# Patient Record
Sex: Female | Born: 1960
Health system: Southern US, Community
[De-identification: ages and names within clinical notes are randomized; demographics above are authoritative.]

## PROBLEM LIST (undated history)

## (undated) DIAGNOSIS — D649 Anemia, unspecified: Secondary | ICD-10-CM

## (undated) HISTORY — PX: NO PAST SURGERIES: SHX2092

---

## 2009-08-19 ENCOUNTER — Emergency Department (HOSPITAL_COMMUNITY): Admission: EM | Admit: 2009-08-19 | Discharge: 2009-08-19 | Payer: Self-pay | Admitting: Emergency Medicine

## 2009-09-13 ENCOUNTER — Ambulatory Visit: Payer: Self-pay | Admitting: Internal Medicine

## 2009-09-13 ENCOUNTER — Encounter (INDEPENDENT_AMBULATORY_CARE_PROVIDER_SITE_OTHER): Payer: Self-pay | Admitting: Adult Health

## 2009-09-13 ENCOUNTER — Ambulatory Visit (HOSPITAL_COMMUNITY): Admission: RE | Admit: 2009-09-13 | Discharge: 2009-09-13 | Payer: Self-pay | Admitting: Internal Medicine

## 2009-09-13 LAB — CONVERTED CEMR LAB
ALT: 17 units/L (ref 0–35)
AST: 26 units/L (ref 0–37)
Basophils Relative: 0 % (ref 0–1)
CO2: 21 meq/L (ref 19–32)
Cholesterol: 204 mg/dL — ABNORMAL HIGH (ref 0–200)
LDL Cholesterol: 106 mg/dL — ABNORMAL HIGH (ref 0–99)
Lymphs Abs: 2.2 10*3/uL (ref 0.7–4.0)
MCHC: 30.7 g/dL (ref 30.0–36.0)
Monocytes Relative: 7 % (ref 3–12)
Neutro Abs: 3.1 10*3/uL (ref 1.7–7.7)
Neutrophils Relative %: 53 % (ref 43–77)
RBC: 5.08 M/uL (ref 3.87–5.11)
Sodium: 141 meq/L (ref 135–145)
Total Bilirubin: 0.3 mg/dL (ref 0.3–1.2)
Total Protein: 7.6 g/dL (ref 6.0–8.3)
VLDL: 27 mg/dL (ref 0–40)
WBC: 5.9 10*3/uL (ref 4.0–10.5)

## 2009-09-16 ENCOUNTER — Ambulatory Visit: Payer: Self-pay | Admitting: Internal Medicine

## 2009-09-19 ENCOUNTER — Encounter: Admission: RE | Admit: 2009-09-19 | Discharge: 2009-09-19 | Payer: Self-pay | Admitting: Internal Medicine

## 2009-10-04 ENCOUNTER — Ambulatory Visit: Payer: Self-pay | Admitting: Internal Medicine

## 2009-11-03 ENCOUNTER — Ambulatory Visit: Payer: Self-pay | Admitting: Internal Medicine

## 2009-11-03 ENCOUNTER — Encounter (INDEPENDENT_AMBULATORY_CARE_PROVIDER_SITE_OTHER): Payer: Self-pay | Admitting: Adult Health

## 2009-11-03 LAB — CONVERTED CEMR LAB
Hemoglobin: 11.9 g/dL — ABNORMAL LOW (ref 12.0–15.0)
Lymphocytes Relative: 48 % — ABNORMAL HIGH (ref 12–46)
Lymphs Abs: 2.9 10*3/uL (ref 0.7–4.0)
MCHC: 31.3 g/dL (ref 30.0–36.0)
Monocytes Absolute: 0.3 10*3/uL (ref 0.1–1.0)
Monocytes Relative: 5 % (ref 3–12)
Neutro Abs: 2.6 10*3/uL (ref 1.7–7.7)
Neutrophils Relative %: 44 % (ref 43–77)
RBC: 5.08 M/uL (ref 3.87–5.11)
WBC: 6 10*3/uL (ref 4.0–10.5)

## 2011-12-04 ENCOUNTER — Inpatient Hospital Stay
Admission: EM | Admit: 2011-12-04 | Discharge: 2011-12-06 | Disposition: A | Discharge status: Home Health | Payer: Self-pay | Attending: Internal Medicine | Admitting: Internal Medicine

## 2011-12-04 ENCOUNTER — Other Ambulatory Visit (HOSPITAL_COMMUNITY): Payer: Self-pay | Admitting: Emergency Medicine

## 2011-12-04 DIAGNOSIS — J189 Pneumonia, unspecified organism: Principal | ICD-10-CM

## 2011-12-04 LAB — URINALYSIS
Bilirubin: NEGATIVE
Glucose: NEGATIVE
Ketones: NEGATIVE
Nitrite: NEGATIVE
Specific Gravity: 1.008 (ref 1.002–1.030)
pH: 6 (ref 5.0–8.0)

## 2011-12-04 MED ORDER — FERROUS SULFATE 324 (65 FE) MG PO TBEC
324.0000 mg | DELAYED_RELEASE_TABLET | Freq: Every day | ORAL | Status: DC
Start: 2011-12-05 — End: 2011-12-06
  Administered 2011-12-05 – 2011-12-06 (×2): 324 mg via ORAL
  Filled 2011-12-04 (×2): qty 1

## 2011-12-04 MED ORDER — ACETAMINOPHEN 325 MG PO TABS
975.00 mg | ORAL_TABLET | Freq: Once | ORAL | Status: AC
Start: 2011-12-04 — End: 2011-12-04
  Filled 2011-12-04: qty 3

## 2011-12-04 MED ORDER — HEPARIN SODIUM (PORCINE) 10000 UNIT/ML IJ SOLN
5000.0000 [IU] | Freq: Three times a day (TID) | INTRAMUSCULAR | Status: DC
Start: 2011-12-04 — End: 2011-12-06
  Filled 2011-12-04 (×2): qty 0.5

## 2011-12-04 MED ORDER — ACETAMINOPHEN 325 MG PO TABS
650.0000 mg | ORAL_TABLET | ORAL | Status: DC | PRN
Start: 2011-12-04 — End: 2011-12-06
  Administered 2011-12-05: 650 mg via ORAL
  Filled 2011-12-04: qty 2

## 2011-12-04 MED ORDER — SODIUM CHLORIDE 0.9 % IV BOLUS
1000.00 mL | INJECTION | Freq: Once | INTRAVENOUS | Status: AC
Start: 2011-12-04 — End: 2011-12-04
  Administered 2011-12-04: 1000 mL via INTRAVENOUS

## 2011-12-04 MED ORDER — SODIUM CHLORIDE 0.9 % IV SOLN
1000.0000 mg | INTRAVENOUS | Status: DC
Start: 2011-12-05 — End: 2011-12-05
  Administered 2011-12-05: 1000 mg via INTRAVENOUS
  Filled 2011-12-04: qty 1000

## 2011-12-04 MED ORDER — FERROUS SULFATE 325 (65 FE) MG OR TABS: 325.00 mg | ORAL_TABLET | Freq: Every day | ORAL | Status: AC

## 2011-12-04 MED ORDER — SODIUM CHLORIDE 0.9 % IV SOLN
INTRAVENOUS | Status: DC | PRN
Start: 2011-12-04 — End: 2011-12-06

## 2011-12-04 MED ORDER — SODIUM CHLORIDE 3 % IN NEBU
5.0000 mL | INHALATION_SOLUTION | Freq: Three times a day (TID) | RESPIRATORY_TRACT | Status: DC
Start: 2011-12-04 — End: 2011-12-05
  Filled 2011-12-04: qty 15

## 2011-12-04 MED ORDER — SODIUM CHLORIDE 0.9 % IV SOLN
500.00 mg | Freq: Once | INTRAVENOUS | Status: AC
Start: 2011-12-04 — End: 2011-12-04
  Filled 2011-12-04: qty 500

## 2011-12-04 MED ORDER — SODIUM CHLORIDE 0.9 % IJ SOLN (CUSTOM)
3.0000 mL | INTRAMUSCULAR | Status: DC | PRN
Start: 2011-12-04 — End: 2011-12-06

## 2011-12-04 MED ORDER — SODIUM CHLORIDE 0.9 % IJ SOLN (CUSTOM)
3.0000 mL | Freq: Three times a day (TID) | INTRAMUSCULAR | Status: DC
Start: 2011-12-04 — End: 2011-12-06
  Administered 2011-12-04 – 2011-12-06 (×4): 3 mL via INTRAVENOUS

## 2011-12-04 MED ORDER — CEFTRIAXONE SODIUM 1 GM IJ SOLR
1000.00 mg | Freq: Once | INTRAMUSCULAR | Status: AC
Start: 2011-12-04 — End: 2011-12-04
  Filled 2011-12-04: qty 1000

## 2011-12-04 MED ORDER — KETOROLAC TROMETHAMINE 30 MG/ML IJ SOLN
30.00 mg | Freq: Once | INTRAMUSCULAR | Status: AC
Start: 2011-12-04 — End: 2011-12-04
  Filled 2011-12-04: qty 1

## 2011-12-04 MED ORDER — SODIUM CHLORIDE 0.9 % IV SOLN
500.0000 mg | INTRAVENOUS | Status: DC
Start: 2011-12-05 — End: 2011-12-05
  Filled 2011-12-04 (×2): qty 500

## 2011-12-05 LAB — ECG, COMPLETE (HC/~~LOC~~/ENCINITAS)
QRS INTERVAL/DURATION: 82 ms
VENTRICULAR RATE: 82 {beats}/min

## 2011-12-05 MED ORDER — IBUPROFEN 600 MG OR TABS
600.0000 mg | ORAL_TABLET | Freq: Four times a day (QID) | ORAL | Status: DC | PRN
Start: 2011-12-05 — End: 2011-12-06
  Filled 2011-12-05 (×3): qty 1

## 2011-12-05 MED ORDER — IBUPROFEN 600 MG OR TABS
600.0000 mg | ORAL_TABLET | Freq: Four times a day (QID) | ORAL | Status: DC | PRN
Start: 2011-12-05 — End: 2011-12-05

## 2011-12-05 MED ORDER — MOXIFLOXACIN HCL 400 MG OR TABS
400.0000 mg | ORAL_TABLET | Freq: Every day | ORAL | Status: DC
Start: 2011-12-06 — End: 2011-12-06
  Administered 2011-12-06: 400 mg via ORAL
  Filled 2011-12-05 (×2): qty 1

## 2011-12-06 MED ORDER — LEVOFLOXACIN 500 MG OR TABS
500.0000 mg | ORAL_TABLET | Freq: Every day | ORAL | Status: DC
Start: 2011-12-07 — End: 2011-12-06

## 2011-12-06 MED ORDER — MOXIFLOXACIN HCL 400 MG OR TABS
400.0000 mg | ORAL_TABLET | Freq: Every day | ORAL | Status: DC
Start: 2011-12-07 — End: 2011-12-06

## 2011-12-06 MED ORDER — LEVOFLOXACIN 500 MG OR TABS
750.00 mg | ORAL_TABLET | Freq: Every day | ORAL | Status: AC
Start: 2011-12-06 — End: 2011-12-10

## 2011-12-21 ENCOUNTER — Encounter (HOSPITAL_COMMUNITY): Payer: Self-pay

## 2012-06-18 ENCOUNTER — Emergency Department
Admit: 2012-06-18 | Discharge: 2012-06-18 | Disposition: A | Discharge status: Home Health | Attending: Emergency Medicine | Admitting: Emergency Medicine

## 2012-06-18 ENCOUNTER — Encounter (HOSPITAL_COMMUNITY): Payer: Self-pay

## 2012-06-18 MED ORDER — HYDROCODONE-ACETAMINOPHEN 5-500 MG OR TABS
1.0000 | ORAL_TABLET | Freq: Once | ORAL | Status: AC
Start: 2012-06-18 — End: 2012-06-18

## 2012-06-18 MED ORDER — HYDROCODONE-ACETAMINOPHEN 5-500 MG OR TABS
1.0000 | ORAL_TABLET | Freq: Once | ORAL | Status: AC
Start: 2012-06-18 — End: 2012-06-18
  Administered 2012-06-18: 1 via ORAL
  Filled 2012-06-18: qty 1

## 2012-06-18 NOTE — ED Attending Note (Signed)
ED ATTENDING NOTE:    The patient was interviewed and examined. I agree with the resident's assessment. The pertinent part of the history includes pt felt pop posterior left knee while running no instability.    PMH/MEDS/ALL confirmed  No past medical history on file.    FH: no DM  SH: no cigarettes  ROS as per HPI additionally,   Review of Systems   Constitutional: Negative for fever.   Respiratory: Negative for cough and shortness of breath.    Cardiovascular: Negative for chest pain and leg swelling.   Gastrointestinal: Negative for abdominal pain and abdominal distention.   Endocrine: Negative for polyuria.   Genitourinary: Negative for dysuria.   Musculoskeletal: Negative for back pain.   Skin: Negative for rash.   Allergic/Immunologic: Negative for immunocompromised state.   Neurological: Negative for headaches.   Hematological: Negative for adenopathy.   Otherwise ROS is negative                    WDWN No acute distress  left knee no effusion  rom sl limited by pain  Stable to strain, neg drawer    xrays no fracture    I agree with the resident assessment and plan. Evaluation and treatment including analgesia  have been initiated.

## 2012-06-18 NOTE — Discharge Instructions (Signed)
Follow up with your primary doctor. If your pain continues you may need an MRI to rule out a problem in the soft tissues. Return for worsening symptoms.     Knee Pain NOS    You have been seen for knee pain.    There are a few causes for knee pain. The doctor feels your knee pain is not from an injury to your knee s bones or ligaments.     Injury to the ligaments or bones is not the only cause of knee pain. There are other causes. These include:   Tendonitis. This is the inflammation (swelling) of the tendons. Tendons are the thick cords that connect the muscles around the knee to the bones of the knee joint.   Bursitis. This is the inflammation (swelling) of the fluid-filled sacs that cushion the knee joint.   Arthritis (inflammation of joints).   Gout (swelling of the joints).   Knee injuries from overuse.    Some things you can do to treat your knee pain are:   Apply ice to the knee with an ice pack. Be sure to put a towel between the ice pack and your skin. You can do this for 15 minutes at a time, several times a day.   Use anti-inflammatory medicine like Ibuprofen (Advil, Motrin) to help the pain and swelling.   Avoid doing things that put a lot of stress on your knee joints. This includes running or playing tennis.    See an Orthopedic Surgeon about knee pain.    Use a knee immobilizer. This will stabilize your knee and help with your knee pain.    You should use crutches to help you walk.    YOU SHOULD SEEK MEDICAL ATTENTION IMMEDIATELY, EITHER HERE OR AT THE NEAREST EMERGENCY DEPARTMENT, IF ANY OF THE FOLLOWING OCCUR:   - Your knee pain gets worse.   You have fevers (temperature higher than 100.24F or 38C) or chills or your knee gets more red or warm.   You have any other problems or concerns.

## 2012-06-18 NOTE — ED Provider Notes (Signed)
History  Chief Complaint   Patient presents with   . Knee Pain     Pt states she was running to catch the trolley when she felt a pop behind her L knee whith sudden onset of pain. now with limited ROM.     HPI  51 year old female with hix of old injury to left ankle, hit her left knee at work last week then while running to catch trolley today she felt a pop behind left knee, +sudden pain, no fall, no head strike or loc. Ambulatory. No weakness/numbness. No redness/swelling, fevers/chills, nausea/vomiting. No history of clots. No chest pain, shortness of breath, abd pain, n/v/d.     No past medical history on file.    No past surgical history on file.    No family history on file.    History   Substance Use Topics   . Smoking status: Not on file   . Smokeless tobacco: Not on file   . Alcohol Use: Not on file   denies tobacco, etoh, other drugs     Review of Systems  All other systems reviewed and negative except as above in HPI    Physical Exam  BP 142/94  Pulse 66  Temp(Src) 98.7 F (37.1 C)  Resp 18  Ht 5\' 1"  (1.549 m)  Wt 68.04 kg (150 lb)  BMI 28.36 kg/m2  SpO2 99%    Physical Exam  Constitutional: Oriented to person, place, and time. Appears well-developed and well-nourished. No distress.   HENT:   Head: Normocephalic and atraumatic.   Mouth/Throat: Oropharynx is clear and moist.   Eyes: EOM are normal. Pupils are equal, round, and reactive to light.   Neck: Neck supple.   Cardiovascular: Normal rate, regular rhythm, normal heart sounds and intact distal pulses.    Pulmonary/Chest: Effort normal and breath sounds normal.   Abdominal: Soft. Bowel sounds are normal. No distension. There is no tenderness.   Musculoskeletal: Left knee with ttp posteriorly. No effusion, erythema. Normal range of motion. Neg anterior/post drawer tests, no varus/valgus laxity, neg mcmurrays. Neg homans. NV intact distally. Ambulatory with nl gait  Neurological: Alert and oriented to person, place, and time. 5/5 strength &  intact light touch sensation in all extremities      ED Course/Medical Decision Making Narrative  51 year old female with left knee pain after feeling pop while running. Benign exam, no effusion. Doubt fracture/dislocation, confirmed with xray. No ligamentous laxity. Ambulatory. NV intact. ?ruptured baker's cyst, muscle strain. Doubt clot, infection. Given analgesia, follow up with primary doctor, return precautions discussed    Discussed with [facility]          Critical Care Time            Additional Notes      Home Medication List  Prior to Admission Medications   Medication Last Dose Informant Patient Reported? Taking?   ferrous sulfate 325 (65 FE) MG tablet Not Taking  Yes No   Take 325 mg by mouth daily.          Katha Kuehne, Janey Genta, MD  Resident  06/18/12 [phone]

## 2012-06-20 NOTE — ED Follow-up Note (Signed)
Follow-up type: Callback       Routine ED Patient Call Back    Patient unable to be contacted, no message left number listed is disconnected

## 2012-07-03 NOTE — ED Follow-up Note (Signed)
Follow-up type: Callback       Routine ED Patient Call Back    Patient unable to be contacted, no message left

## 2013-03-16 ENCOUNTER — Emergency Department
Admission: EM | Admit: 2013-03-16 | Discharge: 2013-03-17 | Disposition: A | Discharge status: Home / Self Care | Payer: MEDICAID | Attending: Emergency Medicine | Admitting: Emergency Medicine

## 2013-03-16 DIAGNOSIS — D509 Iron deficiency anemia, unspecified: Secondary | ICD-10-CM | POA: Insufficient documentation

## 2013-03-16 DIAGNOSIS — R609 Edema, unspecified: Secondary | ICD-10-CM | POA: Insufficient documentation

## 2013-03-16 DIAGNOSIS — I10 Essential (primary) hypertension: Secondary | ICD-10-CM | POA: Insufficient documentation

## 2013-03-16 MED ORDER — FAMOTIDINE 20 MG OR TABS
20.00 mg | ORAL_TABLET | Freq: Once | ORAL | Status: AC
Start: 2013-03-16 — End: 2013-03-16
  Filled 2013-03-16: qty 1

## 2013-03-16 MED ORDER — BELLADONNA ALK-PHENOBARBITAL 16.2 MG/5ML PO ELIX
10.00 mL | ORAL_SOLUTION | Freq: Once | ORAL | Status: AC
Start: 2013-03-16 — End: 2013-03-16
  Filled 2013-03-16: qty 10

## 2013-03-16 MED ORDER — LIDOCAINE VISCOUS 2 % MT SOLN
10.00 mL | Freq: Once | OROMUCOSAL | Status: AC
Start: 2013-03-16 — End: 2013-03-16
  Administered 2013-03-16: 10 mL via ORAL
  Filled 2013-03-16: qty 15

## 2013-03-16 MED ORDER — ALUM & MAG HYDROXIDE-SIMETH 200-200-20 MG/5ML OR SUSP
30.00 mL | Freq: Once | ORAL | Status: AC
Start: 2013-03-16 — End: 2013-03-16
  Filled 2013-03-16: qty 30

## 2013-03-16 NOTE — ED Notes (Signed)
Pt ambulates to bathroom without difficulty.

## 2013-03-16 NOTE — ED Notes (Signed)
Dr Elliot at bedside

## 2013-03-17 LAB — ECG 12-LEAD
QRS INTERVAL/DURATION: 82 ms
VENTRICULAR RATE: 60 {beats}/min

## 2013-03-17 LAB — URINALYSIS
Nitrite: NEGATIVE
pH: 6 (ref 5.0–8.0)

## 2013-03-17 MED ORDER — OMEPRAZOLE 20 MG OR TBEC
20.00 mg | DELAYED_RELEASE_TABLET | Freq: Every day | ORAL | Status: AC
Start: 2013-03-17 — End: ?

## 2013-03-17 NOTE — ED Notes (Signed)
Pt given dc instructions, dr. Mechele Collin at bedside to speak to pt re: findings and follow up, pt knows to fu c optho clinic c phone # provided on dc paperwork or to return sooner if any concerns

## 2013-03-17 NOTE — ED Notes (Signed)
ECG given to Dr. Kahn c prior from [facility], copy in chart

## 2013-03-17 NOTE — ED Notes (Signed)
Signout Note    Assuming care of this 52 year old female with two days of BLE edema - fairly benign examination.  LFTs pending.  No DVTs by bedside ultrasound.  Likely discharge if labs reassuring.    Etter Sjogren, MD  03/17/13 [phone]

## 2013-03-17 NOTE — ED Provider Notes (Signed)
History  Chief Complaint   Patient presents with   . Leg Pain     pt c/o multiple complaints: primary complaint is BLE swelling & pain x2days. Pt also c/o intermittent dizziness and "seeing specks" x months & "buring" sensation to abd. denies cp/sob/f/c/n/v     HPI    [name] with past medical history of htn and pre diabetic. She has iron deficiency anemia as well. She is here for cc of bilateral lower extremity swelling. She states that she noticed this over the past few days. She states that she has been walking around a lot. She denies any history of renal liver disease or hx of heart failure. She has never used diuretics. She is also complaining of epigastric pain with eating. Endorses nausea, no vomiting. She denies any blood in her stool. She also has floaters in her visual field that she has had for three months. No bright lights. Endorses blurred vision, headache on occasion. Follows with her pcp who follows her anemia.     No past medical history on file.  htn  Dm    No past surgical history on file.  none    No family history on file.    History   Substance Use Topics   . Smoking status: Not on file   . Smokeless tobacco: Not on file   . Alcohol Use: Not on file   no alcohol, tobacco, illicit drug use.     Review of Systems  a 12 pt ros performed and neg unless otherwise stated in above hpi.     Physical Exam  BP 143/85  Pulse 68  Temp(Src) 97.3 F (36.3 C)  Resp 18  Ht 5\' 1"  (1.549 m)  Wt 83.915 kg (185 lb)  BMI 34.97 kg/m2  SpO2 99%    Physical Exam   Vitals reviewed.  Constitutional: She is oriented to person, place, and time. She appears well-developed and well-nourished. No distress.   HENT:   Head: Normocephalic and atraumatic.   Mouth/Throat: Oropharynx is clear and moist. No oropharyngeal exudate.   Eyes: Conjunctivae and EOM are normal. Pupils are equal, round, and reactive to light. No scleral icterus.   Neck: Normal range of motion. Neck supple. No JVD present.      Cardiovascular: Normal rate, regular rhythm, normal heart sounds and intact distal pulses.  Exam reveals no gallop and no friction rub.    No murmur heard.  Pulmonary/Chest: Effort normal and breath sounds normal. No respiratory distress. She has no wheezes. She has no rales.   Abdominal: Soft. Bowel sounds are normal. She exhibits no distension and no mass. There is tenderness. There is no rebound and no guarding.   Very minimal epigastric ttp   Musculoskeletal: Normal range of motion.   1 plus pitting edema bilaterally to the knee   Lymphadenopathy:     She has no cervical adenopathy.   Neurological: She is alert and oriented to person, place, and time. No cranial nerve deficit. She exhibits normal muscle tone. Coordination normal.   Skin: Skin is dry. No rash noted. She is not diaphoretic.   Psychiatric: She has a normal mood and affect.       Results for orders placed during the hospital encounter of 03/16/13   CBC WITH ADIFF, BLOOD       Result Value Range    WBC 4.6  4.0 - 10.0 1000/mm3    RBC 4.75  3.90 - 5.20 mill/mm3  Hgb 11.3  11.2 - 15.7 gm/dL    Hct 96.0  45.4 - 09.8 %    MCV 75.2 (*) 79.0 - 95.0 um3    MCH 23.8 (*) 26.0 - 32.0 pgm    MCHC 31.7 (*) 32.0 - 36.0 %    RDW 14.8 (*) 12.0 - 14.0 %    MPV 9.7  9.4 - 12.4 fL    Plt Count 192  140 - 370 1000/mm3    Segs 43  34 - 71 %    Lymphocytes 46  19 - 53 %    Monocytes 7  5 - 12 %    Eosinophils 4  1 - 7 %    Basophils 1  0 - 2 %    Absolute Neutrophil Count 1.9  1.6 - 7.0 1000/mm3    Abs Lymphs 2.1  0.8 - 3.1 1000/mm3    Abs Monos 0.3  0.2 - 0.8 1000/mm3    Abs Eosinophils 0.2  0.0 - 0.5 1000/mm3    Diff Type Automated      Plt Est Normal     BASIC METABOLIC PANEL, BLOOD       Result Value Range    Glucose 93  70 - 115 mg/dL    BUN 10  6 - 20 mg/dL    Creatinine 1.19  1.47 - 0.95 mg/dL    GFR >82      Sodium 141  136 - 145 mmol/L    Potassium 3.9  3.5 - 5.1 mmol/L    Chloride 104  98 - 107 mmol/L    Bicarbonate 25  22 - 29 mmol/L    Calcium 9.7  8.6 -  10.0 mg/dL   LIPASE, BLOOD       Result Value Range    Lipase 87 (*) 13 - 60 U/L   URINALYSIS       Result Value Range    Type Not Specified      Color Yellow  Yellow    Appearance Clear  Clear    Specific Gravity 1.018  1.002 - 1.030    pH 6.0  5.0 - 8.0    Protein Negative  Negative    Glucose Negative  Negative    Ketones Negative  Negative    Bilirubin Negative  Negative    Blood Moderate (*) Negative    Urobilinogen 0.2-1.0  0.2-1 EU/dL    Nitrite Negative  Negative    Leuk Esterase Small (*) Negative    WBC <1  0-2/HPF    RBC 0-2  0-2/HPF    Squam. Epithelial Cell <1  0-Few/HPF   PRO BNP, BLOOD       Result Value Range    BNPP 34  0 - 899 pg/mL   LIVER PANEL, BLOOD       Result Value Range    Total Protein 7.5  6.0 - 8.0 g/dL    Albumin 4.4  3.5 - 5.2 g/dL    Bilirubin, Dir 0.1  <0.2 mg/dL    Bilirubin, Tot 0.2  <1.2 mg/dL    AST (SGOT) 30  0 - 32 U/L    ALT (SGPT) 21  0 - 33 U/L    Alkaline Phos 93  35 - 140 U/L         ED Course/Medical Decision Making Narrative    52 year old female with numerous medical complaints.   Afebrile. Vss. Clinically appears well.   She has normal liver function, renal function. No  protein on her UA.   bnp is negative for heart failure.   Symmetrical swelling without pain, does not appear to be dvt.   Bedside ultrasound neg for dvt performed by attending.   gicocktail and omeprazole started. Will have her follow this up with her pcp within the next few weeks. If she is not improving, she will need GI follow up.   I referred her to ophtho clinic for floaters. Attending bedside US neg for retinal detachment. Visual acuity intact. Instructed her to follow up within one week.   Return precautions provided.       Home Medication List  Prior to Admission Medications   Outpatient Medications Last Dose Informant Patient Reported? Taking?   ferrous sulfate 325 (65 FE) MG tablet   Yes No   Sig: Take 325 mg by mouth daily.      Facility-Administered Medications: None       Lenoria Farrier,  MD  Resident  03/17/13 [phone]

## 2013-03-17 NOTE — ED EKG Interpretation (Signed)
ED EKG Interpretation    EKG: rate 60, nsr, axis nl, no concerning st segment changes or twi.

## 2013-03-17 NOTE — Discharge Instructions (Signed)
LE Edema Etiology Unknown    You have been seen today for swelling of your leg(s).    The cause of your swelling is still not known, despite the evaluation done by the physician.    There are many possible causes for leg swelling. The following is a description of some of theses causes:   Deep Venous Thrombus (DVT): This is a blood clot in one of the deep veins of the leg. Symptoms can include leg pain and swelling. The diagnosis is usually made with an ultrasound of the leg which can detect a blockage in the veins.   Congestive heart failure is a condition where some fluid has built-up in your lungs because of a problem with your heart. The main symptom is shortness of breath which is often worse when you lie down. You may also notice leg swelling and weight gain.   Cellulitis: This is a bacterial infection of the skin. Symptoms usually include redness, swelling, and warmth in the affected area. Some people will have a fever with this infection.   Venous Stasis: This develops when the blood pools in the legs for an extended time. The legs become swollen and sometimes red. The swelling gets better at night because lying flat makes it easier for blood to return to the heart. During the day when a person stands or sits with their legs dangling down, the blood once again has trouble getting out of the legs and the swelling worsens.   Low amounts of Albumin: Albumin is a type of protein that is carried in the blood stream. One of the things that it does is to keep the fluid part of the blood from leaking out of the blood vessels and into the surrounding tissues. Low albumin can happen from poor nutrition, alcoholism, liver disease and other chronic illnesses.    Despite any testing that may have been done here today, the exact cause of your swelling is still not known. Even though the cause is unknown, your physician feels that it is safe for you to be discharged to home. You will need to be seen by your doctor  or a referral physician in order to have further testing done.    YOU SHOULD SEEK MEDICAL ATTENTION IMMEDIATELY, EITHER HERE OR AT THE NEAREST EMERGENCY DEPARTMENT, IF ANY OF THE FOLLOWING OCCURS:   You develop worsening swelling of your leg(s).   You develop redness of the skin of your legs with pain and / or fever.   You develop any shortness of breath or chest pain or tightness or pressure over your heart.   You develop any shortness or breath, especially if you also have pain in your chest when you take a breath.      Gastroesophageal Reflux Disease, GERD    You have been diagnosed with gastroesophageal reflux disease, sometimes called GERD.    This is a condition where acid from the stomach backs up into the esophagus (food pipe). Most people experience this as occasional heartburn. Sometimes, the backup occurs so frequently that the symptoms are severe, and eventually damage may be done to the esophagus.    You may be prescribed medications to decrease the acid secretion in the stomach or to coat the esophagus. Take all medications as directed.    There is no evidence that spicy foods cause reflux. Some people do find that avoiding caffeine, alcohol, and spicy and acidic foods will decrease their discomfort. Simply pay attention to what you eat. If it  makes your symptoms worse, you should avoid eating that particular food.    You should avoid lying down after eating.    You should avoid smoking. Smoking may relax the muscles in the esophagus that keep the acid in the stomach.    Obesity may worsen the symptoms of reflux. Talk to your doctor about a weight loss plan.    Any evening meals should be eaten at least 2-3 hours before bedtime.    Using over-the-counter antacids as directed may provide relief of your symptoms.    You should follow up as directed. You may be referred to a gastroenterologist (a digestive system doctor) for further evaluation.    YOU SHOULD SEEK MEDICAL ATTENTION  IMMEDIATELY, EITHER HERE OR AT THE NEAREST EMERGENCY DEPARTMENT, IF ANY OF THE FOLLOWING OCCURS:   If you have an increase or change in your pain.   If you experience new or different chest pain.   If you develop persistent vomiting or if you are vomiting blood or material that looks like "coffee grounds."   If you have any blood in your stool, or if your stool becomes very dark or looks like tar.      Visual Floaters    You have been seen for what are often called "visual floaters."    Visual floaters (floaters) are shapes that float in your line of vision. They are small and dark. They look like wavy lines or strings. Floaters seem to move away or disappear when you try to look at them. They seem to drift downward when your eyes are still. Bright lights and reading can make it easier to notice visual floaters.    There are many things that can cause visual floaters. Most of them are not threatening to your vision or life. However, they can be very bothersome. These include dry eyes, swelling of the inner eye and a history of laser eye surgery. Also, visual floaters are a normal part of aging.    Some things that can be a sign of a more serious problem are:   May visual floaters that happen all of a sudden.   Visual floaters that come with flashes of light or vision loss.   Very big or a lot of visual floaters or floaters that increase over minutes to hours.   Visual floaters that start after getting hit in the head, face or eye.    Follow up with your primary care doctor or eye specialist. He or she can help care for and treat your visual floaters.    YOU SHOULD SEEK MEDICAL ATTENTION IMMEDIATELY, EITHER HERE OR AT THE NEAREST EMERGENCY DEPARTMENT, IF ANY OF THE FOLLOWING OCCUR:   You have total or partial vision loss.    Eye pain with floaters.   Flashes of light with floaters.   Severe headache.   Your floaters get bigger or there are more of them over minutes to hours.   You have other  concerns.

## 2013-03-17 NOTE — ED Attending Note (Signed)
ED ATTENDING NOTE:      Chief Complaint   Patient presents with   . Leg Pain     pt c/o multiple complaints: primary complaint is BLE swelling & pain x2days. Pt also c/o intermittent dizziness and "seeing specks" x months & "buring" sensation to abd. denies cp/sob/f/c/n/v        HPI:  52 year old female with "pre-HTN & pre-DM" presents c/o 2 days of BLE swelling. States the only time she ever had swelling like this before was when she was pregnant. Has chronic intermittent joint/leg pains ("like a lightning bolt down the middle of my leg, especially when the weather changes") no worse over the past few days. No active CP or SOB. Has had occ HA, lightheadedness, & "spots" in her vision intermittently over the past few months. Also w/worsening blurry vision over the past year. Previously just attributed this to "elevated BP." Was just started to see PMD at [facility] but was told she didn't need any medications, just a re-check in a few months. No known h/o DVT, CAD, or CVA/TIA.     PMH: see HPI  No past surgical history on file.  Soc Hx: occ tob, denies etoh/ivda  No family history on file.    Patient's Medications   New Prescriptions    No medications on file   Previous Medications    FERROUS SULFATE 325 (65 FE) MG TABLET    Take 325 mg by mouth daily.   Modified Medications    No medications on file   Discontinued Medications    No medications on file     No Known Allergies    ROS:  as per HPI, otherwise all systems reviewed and are negative       PE:  slightly disheveled otherwise WNWD female NAD, nontoxic, comfortable  BP 123/94  Pulse 83  Temp(Src) 98.4 F (36.9 C)  Resp 16  Ht 5\' 1"  (1.549 m)  Wt 83.915 kg (185 lb)  BMI 34.97 kg/m2  SpO2 99%  HEENT: ncat, perrl, eomi, poor dentition, slightly dry mucus membranes, R-eye blurry, L-eye w/mobile gray spot in visual field  NECK: supple   CHEST: clear breath sounds bilaterally, no wheezes, rales or rhonchi  CV: rrr, 2+ radial pulses  ABDOMEN: soft,  nondistended, nontender  BACK: no midline or CVA ttp  EXT: mild trace ble edema, calves symmetric, MAE w/FROM, no palpable cord  SKIN: warm, dry, no rashes  NEURO: alert, ox3, cn II-XII intact, motor 5/5 all extremities, sensation intact and symmetric, follows commands     DIAGNOSTIC STUDIES  O2 sat interpretation: normal on RA  Rhythm interpretation:  Visual Acuity - Right Eye (OD) - Uncorrected: 20/50 ; Left Eye (OS) - Uncorrected: 20/40 (-1) ; Both (OU) - Uncorrected: 20/30 (-1) ; Right (OD) - Pin hole/Corrected: 20/30 (-1) ; Left Eye (OS) - Pin hole/Corrected: 20/40 (-1)    Labs:  Results for orders placed during the hospital encounter of 03/16/13   CBC WITH ADIFF, BLOOD       Result Value Range    WBC 4.6  4.0 - 10.0 1000/mm3    RBC 4.75  3.90 - 5.20 mill/mm3    Hgb 11.3  11.2 - 15.7 gm/dL    Hct 16.1  09.6 - 04.5 %    MCV 75.2 (*) 79.0 - 95.0 um3    MCH 23.8 (*) 26.0 - 32.0 pgm    MCHC 31.7 (*) 32.0 - 36.0 %    RDW  14.8 (*) 12.0 - 14.0 %    MPV 9.7  9.4 - 12.4 fL    Plt Count 192  140 - 370 1000/mm3    Segs 43  34 - 71 %    Lymphocytes 46  19 - 53 %    Monocytes 7  5 - 12 %    Eosinophils 4  1 - 7 %    Basophils 1  0 - 2 %    Absolute Neutrophil Count 1.9  1.6 - 7.0 1000/mm3    Abs Lymphs 2.1  0.8 - 3.1 1000/mm3    Abs Monos 0.3  0.2 - 0.8 1000/mm3    Abs Eosinophils 0.2  0.0 - 0.5 1000/mm3    Diff Type Automated      Plt Est Normal     BASIC METABOLIC PANEL, BLOOD       Result Value Range    Glucose 93  70 - 115 mg/dL    BUN 10  6 - 20 mg/dL    Creatinine 1.61  0.96 - 0.95 mg/dL    GFR >04      Sodium 141  136 - 145 mmol/L    Potassium 3.9  3.5 - 5.1 mmol/L    Chloride 104  98 - 107 mmol/L    Bicarbonate 25  22 - 29 mmol/L    Calcium 9.7  8.6 - 10.0 mg/dL   LIPASE, BLOOD       Result Value Range    Lipase 87 (*) 13 - 60 U/L   URINALYSIS       Result Value Range    Type Not Specified      Color Yellow  Yellow    Appearance Clear  Clear    Specific Gravity 1.018  1.002 - 1.030    pH 6.0  5.0 - 8.0    Protein  Negative  Negative    Glucose Negative  Negative    Ketones Negative  Negative    Bilirubin Negative  Negative    Blood Moderate (*) Negative    Urobilinogen 0.2-1.0  0.2-1 EU/dL    Nitrite Negative  Negative    Leuk Esterase Small (*) Negative    WBC <1  0-2/HPF    RBC 0-2  0-2/HPF    Squam. Epithelial Cell <1  0-Few/HPF   PRO BNP, BLOOD       Result Value Range    BNPP 34  0 - 899 pg/mL     EKG: rate 60, nsr, axis nl, no concerning st segment changes or twi.     Bedside US: b/l 2 point compressibility of fem/pop veins w/out e/o DVT  Bedside US: no e/o retinal detachement/hermorrhage, no e/o vitreous hemorrhage, no convex distortion of ocular nerve       ED COURSE/MEDICAL DECISION MAKING:  Pt examined & plan of care discussed w/resident physician Dr. Mechele Collin. Pt w/out e/o DVT or CHF on prelim screening. Perhaps some component of venous status or lymphangitic congestion. LFTs pending to eval for hypoalbuminemia.  She has no e/o significant anemia or AKI. She has no ketones or glucose in her urine, nor is she hyperglycemia. Her BP here is reasonable & she has no focal neurologic findings or CP. Lipase is minimally elevated but w/out significant LUQ pain or N/V. Do not think clinically significant pancreatitis likely. Counseled on keeping BP diary.     IMPRESSION:  - mild lower ext swelling     PLAN: signed out to Dr. Park Breed  -  f/u LFTs  - anticipate d/c w/close oupt f/u

## 2013-03-17 NOTE — ED Notes (Signed)
Urine sent to lab for UA, gray culture tube also sent, extra in fridge

## 2013-03-17 NOTE — ED Notes (Signed)
Assumed care of pt. Pt resting in gurney in nad. Awakens to verbal command

## 2013-03-17 NOTE — ED Notes (Signed)
Patient sleeping.  Will cont to monitor

## 2013-03-17 NOTE — ED Procedure Note (Signed)
Procedure Note  Ultrasound  Date/Time: 03/17/2013 2:40 AM  Performed by: Elba Barman, Sonal Dorwart MF  Authorized by: Elba Barman, Cassius Cullinane MF      Ultrasound Procedure: Deep Vein Thrombosis     Indication   Limited compression ultrasonography of the lower extremity was performed to evaluate for non-compressibility of the common femoral vein (CFV), superficial femoral vein (SFV), and popliteal vein (PV) in the patient. The ultrasound was performed with the following indications, as noted in the H&P: lower extremity swelling.    Probe: Linear Probe (8-59mHz)    Identified structures  Bilateral CFV, SFV, and PV were examined.    Findings   Exam of the above structures revealed the following findings:  CFV Good compressibility Bilateral  SFV Good compressibility Bilateral  Popliteal vein Good compressibility Bilateral    Impression  . Normal lower extremity venous compression limited US, no DVT    Ultrasound Procedure: Ocular     Indication  A focused ultrasound of the orbit was performed to evaluate for retinal detachment, lens dislocation, vitreous hemorrhage, and other ocular pathology. The ultrasound was performed with the following indications, as noted in the H&P: vision change    Probe: Llinear (L8-3)    Identified structures  The following structures were evaluated in both the transverse and sagittal axis:  both eyes    Findings  Exam of the above structures revealed the following findings:  Retinal contour: normal  Lens: normal  Vitreous body: Anechoic  Optic nerve sheath (measured 3mm posterior to the globe): Normal (<83mm)    Impression  Normal ocular ultrasound both eyes

## 2013-03-18 NOTE — ED Follow-up Note (Signed)
Follow-up type: Callback       Routine ED Patient Call Back    Patient unable to be contacted, no message left

## 2014-06-19 ENCOUNTER — Encounter (HOSPITAL_COMMUNITY): Payer: Self-pay | Admitting: Emergency Medicine

## 2014-06-19 ENCOUNTER — Emergency Department
Admission: EM | Admit: 2014-06-19 | Discharge: 2014-06-19 | Disposition: A | Discharge status: Home / Self Care | Payer: MEDICAID | Attending: Emergency Medicine | Admitting: Emergency Medicine

## 2014-06-19 DIAGNOSIS — L989 Disorder of the skin and subcutaneous tissue, unspecified: Principal | ICD-10-CM | POA: Insufficient documentation

## 2014-06-19 DIAGNOSIS — R110 Nausea: Secondary | ICD-10-CM | POA: Insufficient documentation

## 2014-06-19 DIAGNOSIS — L819 Disorder of pigmentation, unspecified: Secondary | ICD-10-CM

## 2014-06-19 DIAGNOSIS — D649 Anemia, unspecified: Secondary | ICD-10-CM | POA: Insufficient documentation

## 2014-06-19 DIAGNOSIS — R0602 Shortness of breath: Secondary | ICD-10-CM | POA: Insufficient documentation

## 2014-06-19 HISTORY — DX: Anemia, unspecified: D64.9

## 2014-06-19 LAB — METERED HGB (POCT): Hgb (POCT) (Metered): 10.6 gm/dL — ABNORMAL LOW (ref 11.2–15.7)

## 2014-06-19 NOTE — Discharge Instructions (Signed)
Anemia, Chronic     You have been seen for your chronic anemia (low blood count).     Anemia means "a low red blood cell count." Red blood cells are a part of your blood. These carry oxygen. Blood also has white blood cells, which fight infection and platelets, which help blood to clot.     Symptoms of anemia include fatigue (feeling tired) and weakness. Symptoms also include shortness of breath or chest pain with exercise or even normal activity. Another sign is pale color of the skin, lips and fingernail beds.     After an evaluation, the doctor thinks your blood count IS NOT so low that you need a blood transfusion. Follow-up with your regular doctor for more rechecks on the blood count.     YOU SHOULD SEEK MEDICAL ATTENTION IMMEDIATELY, EITHER HERE OR AT THE NEAREST EMERGENCY DEPARTMENT, IF ANY OF THE FOLLOWING OCCURS:  · You get light-headed and dizzy as if about to faint or have worsening shortness of breath and/or chest pain during normal activity like walking or climbing stairs.  · You have any new sources of bleeding.

## 2014-06-19 NOTE — ED Notes (Signed)
Pt cleared for discharge. Instructions reviewed/pt verbalizes understanding. Return to ED if any new/worsening symptoms. Discharge instructions signed/witnessed. Pt ambulatory from ED with steady gait.

## 2014-06-19 NOTE — ED Provider Notes (Signed)
Emergency Department Note  [facility] electronic medical record reviewed for pertinent medical history.     Nursing Triage Note:   Chief Complaint   Patient presents with    Nausea     Pt presents to ED today per self, states told to come to here, main c/c is "hands turning [place]-yellow", multiple other complaints, pt alert, ox3, denies pain, c/o some leg aches, denies other pain       HPI:   53 year old female with a PMH significant for anemia presenting with [place] hands. Pt reports that for the last few days her palms have gradually become [place]. Pt denies abd pain, liver disease, hx of hepatitis.   Of note, patient has been using [employer]'s Glow skin lotion for the past week, which per the website is a sunless tan solution, which patient did not realize.   ROS sig for occ nausea and occ sob that happens 1-2 per week chronically.    HPI    Past Medical History   Diagnosis Date    Anemia        Past Surgical History   Procedure Laterality Date    No past surgeries         History   Substance Use Topics    Smoking status: Never Smoker     Smokeless tobacco: Not on file    Alcohol Use: No       Medications:   Prior to Admission Medications   Prescriptions Last Dose Informant Patient Reported? Taking?   ferrous sulfate 325 (65 FE) MG tablet   Yes No   Sig: Take 325 mg by mouth daily.   omeprazole (PRILOSEC OTC) 20 MG EC tablet   No No   Sig: Take 20 mg by mouth daily.      Facility-Administered Medications: None       Allergies: Review of patient's allergies indicates no known allergies.    Review of Systems:   Gen: Denies fevers, chills  HEENT: Denies headache, blurred vision, nasal congestion,   CV: Denies CP, palpitations  Lung: Denies cough  Abd: Denies abd pain, vomiting  GU: Denies dark urine, hematuria, dysuria  Psych: Denies SI, HI, or AVH  [place]: Denies weakness or numbness     Review of Systems  All other systems reviewed and negative unless otherwise noted in the HPI or above. This was done per my custom  and practice for systems appropriate to the chief complaint in an emergency department setting and varies depending on the quality of history that the patient is able to provide.      Physical Exam:  4    06/19/14  0725 06/19/14  0922   BP: 145/101 135/100   Pulse: 80 77   Temp: 97.9 F (36.6 C) 97.9 F (36.6 C)   Resp: 16 18   SpO2: 98% 100%     Nursing note and vitals reviewed.     Physical Exam  General: NAD, nontoxic   HEENT: No icterus, no jaundice of mucus membranes,PERRL, MMM   Neck: No neck stiffness or tenderness   CV: rrr, no m/r/g   Pulm: ctab, no increased WOB   Abd: NT/ND, normoactive bs   Extr: BLE without edema, 2+ DP and radial pulses bilaterally   Skin: Palms with artificial appearing dark  color. Otherwise normal color, no jaundice or discoloration elsewhere.    Neuro: AAOx3, MAE, no ataxia, normal gait   Psych: mood/affect appropriate, linear thinking.  Impression & Initial ED Plan:  53 year old  female presents with yellowing/oranging of palms in setting of using a sunless tan solution for past week. Also with occ nausea and sob. Exam with  skin of palms but no icterus or mucus membranes discoloration. Suspect the lotion is causing color change of palms. Doubt liver disease or hyperbilirubinemia. Given occ sob and hx of anemia will do poc hgb check.    - poc hgb  - reassessment       The rest of the ED course, results, and plan for the patient is in a separate continuation note. Please see that note for details.       I have discussed my evaluation and care plan for the patient with the attending physician Dr. Early Chars.      Melida Gimenez, MD  Resident  06/19/14 [address] Hardin Negus, MD  06/19/14 [phone]

## 2014-06-19 NOTE — ED MD Progress Note (Signed)
POC Hgb 10.6, similar to prior levels. Suspect her anemia is at baseline, unlikely contributing to her current hand complaint. D/C with f/u to pmd.

## 2014-06-22 NOTE — ED Follow-up Note (Signed)
Follow-up type: Callback       Routine ED Patient Call Back    Patient unable to be contacted, no message left

## 2016-11-30 ENCOUNTER — Other Ambulatory Visit: Payer: Self-pay

## 2016-11-30 ENCOUNTER — Ambulatory Visit: Payer: Self-pay | Attending: Family Medicine | Admitting: Family Medicine

## 2016-11-30 VITALS — BP 149/111 | HR 84 | Temp 98.2°F | Resp 18 | Ht 61.0 in | Wt 203.0 lb

## 2016-11-30 DIAGNOSIS — F329 Major depressive disorder, single episode, unspecified: Secondary | ICD-10-CM | POA: Insufficient documentation

## 2016-11-30 DIAGNOSIS — Z0000 Encounter for general adult medical examination without abnormal findings: Secondary | ICD-10-CM | POA: Insufficient documentation

## 2016-11-30 DIAGNOSIS — R200 Anesthesia of skin: Secondary | ICD-10-CM | POA: Insufficient documentation

## 2016-11-30 DIAGNOSIS — Z1239 Encounter for other screening for malignant neoplasm of breast: Secondary | ICD-10-CM

## 2016-11-30 DIAGNOSIS — G8929 Other chronic pain: Secondary | ICD-10-CM | POA: Insufficient documentation

## 2016-11-30 DIAGNOSIS — F419 Anxiety disorder, unspecified: Secondary | ICD-10-CM | POA: Insufficient documentation

## 2016-11-30 DIAGNOSIS — I10 Essential (primary) hypertension: Secondary | ICD-10-CM | POA: Insufficient documentation

## 2016-11-30 DIAGNOSIS — Z1231 Encounter for screening mammogram for malignant neoplasm of breast: Secondary | ICD-10-CM

## 2016-11-30 DIAGNOSIS — F32A Depression, unspecified: Secondary | ICD-10-CM

## 2016-11-30 DIAGNOSIS — R102 Pelvic and perineal pain: Secondary | ICD-10-CM | POA: Insufficient documentation

## 2016-11-30 DIAGNOSIS — H538 Other visual disturbances: Secondary | ICD-10-CM

## 2016-11-30 DIAGNOSIS — R202 Paresthesia of skin: Secondary | ICD-10-CM | POA: Insufficient documentation

## 2016-11-30 DIAGNOSIS — M25511 Pain in right shoulder: Secondary | ICD-10-CM | POA: Insufficient documentation

## 2016-11-30 DIAGNOSIS — N3001 Acute cystitis with hematuria: Secondary | ICD-10-CM | POA: Insufficient documentation

## 2016-11-30 DIAGNOSIS — R079 Chest pain, unspecified: Secondary | ICD-10-CM | POA: Insufficient documentation

## 2016-11-30 LAB — CBC WITH DIFFERENTIAL/PLATELET
BASOS ABS: 49 {cells}/uL (ref 0–200)
Basophils Relative: 1 %
EOS ABS: 98 {cells}/uL (ref 15–500)
EOS PCT: 2 %
HCT: 40.2 % (ref 35.0–45.0)
Hemoglobin: 12.4 g/dL (ref 11.7–15.5)
LYMPHS PCT: 47 %
Lymphs Abs: 2303 cells/uL (ref 850–3900)
MCH: 23.2 pg — AB (ref 27.0–33.0)
MCHC: 30.8 g/dL — AB (ref 32.0–36.0)
MCV: 75.1 fL — AB (ref 80.0–100.0)
MONOS PCT: 4 %
MPV: 9.9 fL (ref 7.5–12.5)
Monocytes Absolute: 196 cells/uL — ABNORMAL LOW (ref 200–950)
NEUTROS PCT: 46 %
Neutro Abs: 2254 cells/uL (ref 1500–7800)
PLATELETS: 181 10*3/uL (ref 140–400)
RBC: 5.35 MIL/uL — ABNORMAL HIGH (ref 3.80–5.10)
RDW: 16.8 % — AB (ref 11.0–15.0)
WBC: 4.9 10*3/uL (ref 3.8–10.8)

## 2016-11-30 LAB — HEPATIC FUNCTION PANEL
ALBUMIN: 4.6 g/dL (ref 3.6–5.1)
ALT: 18 U/L (ref 6–29)
AST: 23 U/L (ref 10–35)
Alkaline Phosphatase: 92 U/L (ref 33–130)
BILIRUBIN TOTAL: 0.5 mg/dL (ref 0.2–1.2)
Bilirubin, Direct: 0.1 mg/dL (ref <=0.2)
Indirect Bilirubin: 0.4 mg/dL (ref 0.2–1.2)
Total Protein: 7.7 g/dL (ref 6.1–8.1)

## 2016-11-30 LAB — POCT URINALYSIS DIPSTICK
Bilirubin, UA: NEGATIVE
GLUCOSE UA: NEGATIVE
Ketones, UA: NEGATIVE
NITRITE UA: NEGATIVE
Spec Grav, UA: 1.02
UROBILINOGEN UA: 0.2
pH, UA: 5.5

## 2016-11-30 LAB — BASIC METABOLIC PANEL WITH GFR
BUN: 14 mg/dL (ref 7–25)
CALCIUM: 9.9 mg/dL (ref 8.6–10.4)
CO2: 22 mmol/L (ref 20–31)
CREATININE: 0.8 mg/dL (ref 0.50–1.05)
Chloride: 107 mmol/L (ref 98–110)
GFR, Est Non African American: 82 mL/min (ref >=60)
GLUCOSE: 82 mg/dL (ref 65–99)
Potassium: 3.8 mmol/L (ref 3.5–5.3)
Sodium: 142 mmol/L (ref 135–146)

## 2016-11-30 LAB — LIPID PANEL
CHOL/HDL RATIO: 2.2 ratio (ref <5.0)
Cholesterol: 180 mg/dL (ref <200)
HDL: 83 mg/dL (ref >50)
LDL Cholesterol: 70 mg/dL (ref <100)
Triglycerides: 137 mg/dL (ref <150)
VLDL: 27 mg/dL (ref <30)

## 2016-11-30 LAB — TSH: TSH: 3.49 mIU/L

## 2016-11-30 MED ORDER — AMITRIPTYLINE HCL 25 MG PO TABS: 25.0000 mg | ORAL_TABLET | Freq: Every day | ORAL | 1 refills | Status: DC

## 2016-11-30 MED ORDER — HYDROCHLOROTHIAZIDE 25 MG PO TABS: 25.0000 mg | ORAL_TABLET | Freq: Every day | ORAL | 2 refills | Status: DC

## 2016-11-30 MED ORDER — NITROFURANTOIN MONOHYD MACRO 100 MG PO CAPS
100.0000 mg | ORAL_CAPSULE | Freq: Two times a day (BID) | ORAL | 0 refills | Status: DC
Start: 2016-11-30 — End: 2016-12-14

## 2016-11-30 MED FILL — NITROFURANTOIN MONO-MCR 100: 100 | 5 days supply | Qty: 10 | Fill #0

## 2016-11-30 MED FILL — ?AMITRIPTYLINE HCL 25 MG TA: 25 | 30 days supply | Qty: 30 | Fill #0

## 2016-11-30 MED FILL — HYDROCHLOROTHIAZIDE 25 MG T: 25 | 30 days supply | Qty: 30 | Fill #0

## 2016-11-30 NOTE — Progress Notes (Signed)
Subjective:   Patient ID: Carla Yu, female    DOB: January 06, 1961, 56 y.o.   MRN: [id number]  Chief Complaint  Patient presents with  . Establish Care   HPI Carla Yu 56 y.o. female presents with Annual physical exam. She does report history of anxiety since childhood. Reports an anxiety medication was recommended in the past but she declined. 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 She reports chronic right shoulder pain since April of last year relieved with use of Tylenol.     No past medical history on file.  No past surgical history on file.  Family History  Problem Relation Age of Onset  . Depression Mother   . Hypertension Mother   . Cancer Father     Social History   Social History  . Marital status: Single    Spouse name: N/A  . Number of children: N/A  . Years of education: N/A   Occupational History  . Not on file.   Social History Main Topics  . Smoking status: Not on file  . Smokeless tobacco: Not on file  . Alcohol use Not on file  . Drug use: Unknown  . Sexual activity: Not on file    No outpatient prescriptions prior to visit.   No facility-administered medications prior to visit.     Allergies  Allergen Reactions  . Ibuprofen Nausea Only and Palpitations    Review of Systems  Constitutional: Negative.   Eyes: Positive for blurred vision.  Respiratory: Negative.   Cardiovascular: Positive for chest pain.  Gastrointestinal: Positive for abdominal pain and heartburn.  Genitourinary: Negative.   Musculoskeletal: Positive for myalgias (right shoulder ).  Skin: Negative.   Neurological: Negative.   Endo/Heme/Allergies: Positive for polydipsia.  Psychiatric/Behavioral: The patient is nervous/anxious.        Objective:    Physical Exam  Constitutional: She is oriented to person, place, and time. She appears well-developed and well-nourished.  HENT:  Head: Normocephalic and atraumatic.  Right Ear:  External ear normal.  Left Ear: External ear normal.  Nose: Nose normal.  Mouth/Throat: Oropharynx is clear and moist.  Eyes: Conjunctivae and EOM are normal. Pupils are equal, round, and reactive to light.  Neck: Normal range of motion. Neck supple.  Cardiovascular: Normal rate, regular rhythm, normal heart sounds and intact distal pulses.   Normal EKG.  Pulmonary/Chest: Effort normal and breath sounds normal.  Abdominal: Soft. Bowel sounds are normal.  Musculoskeletal:       Right shoulder: She exhibits pain (chronic pain). She exhibits no tenderness.  Neurological: She is alert and oriented to person, place, and time. She has normal reflexes.  Skin: Skin is warm and dry.  Psychiatric: Her behavior is normal. Her mood appears anxious. Her speech is rapid and/or pressured. She expresses no homicidal and no suicidal ideation. She expresses no suicidal plans and no homicidal plans.  Nursing note and vitals reviewed.   BP (!) 149/111 (BP Location: Left Arm, Patient Position: Sitting, Cuff Size: Normal)   Pulse 84   Temp 98.2 F (36.8 C) (Oral)   Resp 18   Ht 5\' 1"  (1.549 m)   Wt 203 lb (92.1 kg)   SpO2 99%   BMI 38.36 kg/m  Wt Readings from Last 3 Encounters:  11/30/16 203 lb (92.1 kg)    Lab Results  Component Value Date   TSH 3.49 11/30/2016   Lab Results  Component Value Date   WBC 4.9 11/30/2016   HGB 12.4  11/30/2016   HCT 40.2 11/30/2016   MCV 75.1 (L) 11/30/2016   PLT 181 11/30/2016   Lab Results  Component Value Date   NA 142 11/30/2016   K 3.8 11/30/2016   CO2 22 11/30/2016   GLUCOSE 82 11/30/2016   BUN 14 11/30/2016   CREATININE 0.80 11/30/2016   BILITOT 0.5 11/30/2016   ALKPHOS 92 11/30/2016   AST 23 11/30/2016   ALT 18 11/30/2016   PROT 7.7 11/30/2016   ALBUMIN 4.6 11/30/2016   CALCIUM 9.9 11/30/2016   Lab Results  Component Value Date   CHOL 180 11/30/2016   CHOL 204 (H) 09/13/2009   Lab Results  Component Value Date   HDL 83 11/30/2016    HDL 71 09/13/2009   Lab Results  Component Value Date   LDLCALC 70 11/30/2016   LDLCALC 106 (H) 09/13/2009   Lab Results  Component Value Date   TRIG 137 11/30/2016   TRIG 135 09/13/2009   Lab Results  Component Value Date   CHOLHDL 2.2 11/30/2016   CHOLHDL 2.9 Ratio 09/13/2009   Lab Results  Component Value Date   HGBA1C 5.6 11/30/2016       Assessment & Plan:   Problem List Items Addressed This Visit    None    Visit Diagnoses    Annual physical exam    -  Primary   Relevant Orders   BASIC METABOLIC PANEL WITH GFR (Completed)   CBC with Differential (Completed)   Hemoglobin A1c (Completed)   TSH (Completed)   Vitamin D, 25-hydroxy (Completed)   Lipid Panel (Completed)   Hepatic Function Panel (Completed)   Chronic right shoulder pain       Essential hypertension       Relevant Medications   hydrochlorothiazide (HYDRODIURIL) 25 MG tablet   Other Relevant Orders   BASIC METABOLIC PANEL WITH GFR (Completed)   Microalbumin/Creatinine Ratio, Urine (Completed)   Screening for breast cancer       Relevant Orders   MM DIGITAL SCREENING BILATERAL   Anxiety disorder, unspecified type       Relevant Medications   amitriptyline (ELAVIL) 25 MG tablet   Healthcare maintenance       Relevant Orders   Hepatitis C Antibody (Completed)   HIV antibody (with reflex) (Completed)   MM DIGITAL SCREENING BILATERAL   Ambulatory referral to Gastroenterology   Numbness and tingling of both lower extremities       Relevant Medications   amitriptyline (ELAVIL) 25 MG tablet   Other Relevant Orders   Hemoglobin A1c (Completed)   Vitamin D, 25-hydroxy (Completed)   Depression, unspecified depression type       Relevant Medications   amitriptyline (ELAVIL) 25 MG tablet   Blurred vision, bilateral       Relevant Orders   Ambulatory referral to Ophthalmology   Pelvic pain       Relevant Medications   nitrofurantoin, macrocrystal-monohydrate, (MACROBID) 100 MG capsule   Other  Relevant Orders   Urinalysis Dipstick (Completed)   Acute cystitis with hematuria       Relevant Medications   nitrofurantoin, macrocrystal-monohydrate, (MACROBID) 100 MG capsule   Other Relevant Orders   Urinalysis Dipstick (Completed)   Chest pain, unspecified type       Relevant Orders   -EKG 12- Lead   CBC with Differential (Completed)      I am having Ms. Lungren start on amitriptyline, nitrofurantoin (macrocrystal-monohydrate), and hydrochlorothiazide.  Meds ordered this encounter  Medications  . amitriptyline (  ELAVIL) 25 MG tablet    Sig: Take 1 tablet (25 mg total) by mouth at bedtime.    Dispense:  30 tablet    Refill:  1    Order Specific Question:   Supervising Provider    Answer:   JEGEDE, OLUGBEMIGA E [id number]  . nitrofurantoin, macroQuentin Angstcrystal-monohydrate, (MACROBID) 100 MG capsule    Sig: Take 1 capsule (100 mg total) by mouth 2 (two) times daily. For 5 days. Take with food.    Dispense:  10 capsule    Refill:  0    Order Specific Question:   Supervising Provider    Answer:   Quentin AngstJEGEDE, OLUGBEMIGA E [id number]  . hydrochlorothiazide (HYDRODIURIL) 25 MG tablet    Sig: Take 1 tablet (25 mg total) by mouth daily.    Dispense:  30 tablet    Refill:  2    Order Specific Question:   Supervising Provider    Answer:   Quentin AngstJEGEDE, OLUGBEMIGA E [[id number]]     Arrie SenateMandesia Caylin Nass, FNP

## 2016-11-30 NOTE — Progress Notes (Signed)
Patient is here for Physical  Patient has eaten a fiber one bar  Patient is not taking any meds currently  Patient denies pain   Patient declined the flu shot today

## 2016-11-30 NOTE — Patient Instructions (Addendum)
Get financial packet to apply for orange card Follow up in 6 weeks for medication follow up.  Urinary Tract Infection, Adult Introduction A urinary tract infection (UTI) is an infection of any part of the urinary tract. The urinary tract includes the:  Kidneys.  Ureters.  Bladder.  Urethra. These organs make, store, and get rid of pee (urine) in the body. Follow these instructions at home:  Take over-the-counter and prescription medicines only as told by your doctor.  If you were prescribed an antibiotic medicine, take it as told by your doctor. Do not stop taking the antibiotic even if you start to feel better.  Avoid the following drinks:  Alcohol.  Caffeine.  Tea.  Carbonated drinks.  Drink enough fluid to keep your pee clear or pale yellow.  Keep all follow-up visits as told by your doctor. This is important.  Make sure to:  Empty your bladder often and completely. Do not to hold pee for long periods of time.  Empty your bladder before and after sex.  Wipe from front to back after a bowel movement if you are female. Use each tissue one time when you wipe. Contact a doctor if:  You have back pain.  You have a fever.  You feel sick to your stomach (nauseous).  You throw up (vomit).  Your symptoms do not get better after 3 days.  Your symptoms go away and then come back. Get help right away if:  You have very bad back pain.  You have very bad lower belly (abdominal) pain.  You are throwing up and cannot keep down any medicines or water. This information is not intended to replace advice given to you by your health care provider. Make sure you discuss any questions you have with your health care provider. Document Released: 03/19/2008 Document Revised: 03/08/2016 Document Reviewed: 08/22/2015  2017 Elsevier  Hypertension Hypertension is another name for high blood pressure. High blood pressure forces your heart to work harder to pump blood. A blood  pressure reading has two numbers, which includes a higher number over a lower number (example: 110/72). Follow these instructions at home:  Have your blood pressure rechecked by your doctor.  Only take medicine as told by your doctor. Follow the directions carefully. The medicine does not work as well if you skip doses. Skipping doses also puts you at risk for problems.  Do not smoke.  Monitor your blood pressure at home as told by your doctor. Contact a doctor if:  You think you are having a reaction to the medicine you are taking.  You have repeat headaches or feel dizzy.  You have puffiness (swelling) in your ankles.  You have trouble with your vision. Get help right away if:  You get a very bad headache and are confused.  You feel weak, numb, or faint.  You get chest or belly (abdominal) pain.  You throw up (vomit).  You cannot breathe very well. This information is not intended to replace advice given to you by your health care provider. Make sure you discuss any questions you have with your health care provider. Document Released: 03/19/2008 Document Revised: 03/08/2016 Document Reviewed: 07/24/2013 Elsevier Interactive Patient Education  2017 Elsevier Inc.  Hydrochlorothiazide, HCTZ; Losartan tablets What is this medicine? LOSARTAN; HYDROCHLOROTHIAZIDE (loe SAR tan; hye droe klor oh THYE a zide) is a combination of a drug that relaxes blood vessels and a diuretic. It is used to treat high blood pressure. This medicine may also reduce the  risk of stroke in certain patients. This medicine may be used for other purposes; ask your health care provider or pharmacist if you have questions. COMMON BRAND NAME(S): Hyzaar What should I tell my health care provider before I take this medicine? They need to know if you have any of these conditions: -decreased urine -kidney disease -liver disease -if you are on a special diet, like a low-salt diet -immune system problems, like  lupus -an unusual or allergic reaction to losartan, hydrochlorothiazide, sulfa drugs, other medicines, foods, dyes, or preservatives -pregnant or trying to get pregnant -breast-feeding How should I use this medicine? Take this medicine by mouth with a glass of water. Follow the directions on the prescription label. You can take it with or without food. If it upsets your stomach, take it with food. Take your medicine at regular intervals. Do not take it more often than directed. Do not stop taking except on your doctor's advice. Talk to your pediatrician regarding the use of this medicine in children. Special care may be needed. Overdosage: If you think you have taken too much of this medicine contact a poison control center or emergency room at once. NOTE: This medicine is only for you. Do not share this medicine with others. What if I miss a dose? If you miss a dose, take it as soon as you can. If it is almost time for your next dose, take only that dose. Do not take double or extra doses. What may interact with this medicine? -barbiturates, like phenobarbital -blood pressure medicines -celecoxib -cimetidine -corticosteroids -diabetic medicines -diuretics, especially triamterene, spironolactone or amiloride -fluconazole -lithium -NSAIDs, medicines for pain and inflammation, like ibuprofen or naproxen -potassium salts or potassium supplements -prescription pain medicines -rifampin -skeletal muscle relaxants like tubocurarine -some cholesterol-lowering medicines like cholestyramine or colestipol This list may not describe all possible interactions. Give your health care provider a list of all the medicines, herbs, non-prescription drugs, or dietary supplements you use. Also tell them if you smoke, drink alcohol, or use illegal drugs. Some items may interact with your medicine. What should I watch for while using this medicine? Check your blood pressure regularly while you are taking this  medicine. Ask your doctor or health care professional what your blood pressure should be and when you should contact him or her. When you check your blood pressure, write down the measurements to show your doctor or health care professional. If you are taking this medicine for a long time, you must visit your health care professional for regular checks on your progress. Make sure you schedule appointments on a regular basis. You must not get dehydrated. Ask your doctor or health care professional how much fluid you need to drink a day. Check with him or her if you get an attack of severe diarrhea, nausea and vomiting, or if you sweat a lot. The loss of too much body fluid can make it dangerous for you to take this medicine. Women should inform their doctor if they wish to become pregnant or think they might be pregnant. There is a potential for serious side effects to an unborn child, particularly in the second or third trimester. Talk to your health care professional or pharmacist for more information. You may get drowsy or dizzy. Do not drive, use machinery, or do anything that needs mental alertness until you know how this drug affects you. Do not stand or sit up quickly, especially if you are an older patient. This reduces the risk of dizzy  or fainting spells. Alcohol can make you more drowsy and dizzy. Avoid alcoholic drinks. This medicine may affect your blood sugar level. If you have diabetes, check with your doctor or health care professional before changing the dose of your diabetic medicine. Avoid salt substitutes unless you are told otherwise by your doctor or health care professional. Do not treat yourself for coughs, colds, or pain while you are taking this medicine without asking your doctor or health care professional for advice. Some ingredients may increase your blood pressure. What side effects may I notice from receiving this medicine? Side effects that you should report to your doctor or  health care professional as soon as possible: -allergic reactions like skin rash, itching or hives, swelling of the face, lips, or tongue -breathing problems -changes in vision -dark urine -eye pain -fast or irregular heart beat, palpitations, or chest pain -feeling faint or lightheaded -muscle cramps -persistent dry cough -redness, blistering, peeling or loosening of the skin, including inside the mouth -stomach pain -trouble passing urine or change in the amount of urine -unusual bleeding or bruising -worsened gout pain -yellowing of the eyes or skin Side effects that usually do not require medical attention (report to your doctor or health care professional if they continue or are bothersome): -change in sex drive or performance -headache This list may not describe all possible side effects. Call your doctor for medical advice about side effects. You may report side effects to FDA at 1-800-FDA-1088. Where should I keep my medicine? Keep out of the reach of children. Store at room temperature between 15 and 30 degrees C (59 and 86 degrees F). Protect from light. Keep container tightly closed. Throw away any unused medicine after the expiration date. NOTE: This sheet is a summary. It may not cover all possible information. If you have questions about this medicine, talk to your doctor, pharmacist, or health care provider.  2017 Elsevier/Gold Standard (2010-06-21 13:57:32)   Amitriptyline tablets What is this medicine? AMITRIPTYLINE (a mee TRIP ti leen) is used to treat depression. This medicine may be used for other purposes; ask your health care provider or pharmacist if you have questions. COMMON BRAND NAME(S): Elavil, Vanatrip What should I tell my health care provider before I take this medicine? They need to know if you have any of these conditions: -an alcohol problem -asthma, difficulty breathing -bipolar disorder or schizophrenia -difficulty passing urine, prostate  trouble -glaucoma -heart disease or previous heart attack -liver disease -over active thyroid -seizures -thoughts or plans of suicide, a previous suicide attempt, or family history of suicide attempt -an unusual or allergic reaction to amitriptyline, other medicines, foods, dyes, or preservatives -pregnant or trying to get pregnant -breast-feeding How should I use this medicine? Take this medicine by mouth with a drink of water. Follow the directions on the prescription label. You can take the tablets with or without food. Take your medicine at regular intervals. Do not take it more often than directed. Do not stop taking this medicine suddenly except upon the advice of your doctor. Stopping this medicine too quickly may cause serious side effects or your condition may worsen. A special MedGuide will be given to you by the pharmacist with each prescription and refill. Be sure to read this information carefully each time. Talk to your pediatrician regarding the use of this medicine in children. Special care may be needed. Overdosage: If you think you have taken too much of this medicine contact a poison control center or emergency room  at once. NOTE: This medicine is only for you. Do not share this medicine with others. What if I miss a dose? If you miss a dose, take it as soon as you can. If it is almost time for your next dose, take only that dose. Do not take double or extra doses. What may interact with this medicine? Do not take this medicine with any of the following medications: -arsenic trioxide -certain medicines used to regulate abnormal heartbeat or to treat other heart conditions -cisapride -droperidol -halofantrine -linezolid -MAOIs like Carbex, Eldepryl, Marplan, Nardil, and Parnate -methylene blue -other medicines for mental depression -phenothiazines like perphenazine, thioridazine and chlorpromazine -pimozide -probucol -procarbazine -sparfloxacin -St. John's  Wort -ziprasidone This medicine may also interact with the following medications: -atropine and related drugs like hyoscyamine, scopolamine, tolterodine and others -barbiturate medicines for inducing sleep or treating seizures, like phenobarbital -cimetidine -disulfiram -ethchlorvynol -thyroid hormones such as levothyroxine This list may not describe all possible interactions. Give your health care provider a list of all the medicines, herbs, non-prescription drugs, or dietary supplements you use. Also tell them if you smoke, drink alcohol, or use illegal drugs. Some items may interact with your medicine. What should I watch for while using this medicine? Tell your doctor if your symptoms do not get better or if they get worse. Visit your doctor or health care professional for regular checks on your progress. Because it may take several weeks to see the full effects of this medicine, it is important to continue your treatment as prescribed by your doctor. Patients and their families should watch out for new or worsening thoughts of suicide or depression. Also watch out for sudden changes in feelings such as feeling anxious, agitated, panicky, irritable, hostile, aggressive, impulsive, severely restless, overly excited and hyperactive, or not being able to sleep. If this happens, especially at the beginning of treatment or after a change in dose, call your health care professional. Bonita Quin may get drowsy or dizzy. Do not drive, use machinery, or do anything that needs mental alertness until you know how this medicine affects you. Do not stand or sit up quickly, especially if you are an older patient. This reduces the risk of dizzy or fainting spells. Alcohol may interfere with the effect of this medicine. Avoid alcoholic drinks. Do not treat yourself for coughs, colds, or allergies without asking your doctor or health care professional for advice. Some ingredients can increase possible side effects. Your  mouth may get dry. Chewing sugarless gum or sucking hard candy, and drinking plenty of water will help. Contact your doctor if the problem does not go away or is severe. This medicine may cause dry eyes and blurred vision. If you wear contact lenses you may feel some discomfort. Lubricating drops may help. See your eye doctor if the problem does not go away or is severe. This medicine can cause constipation. Try to have a bowel movement at least every 2 to 3 days. If you do not have a bowel movement for 3 days, call your doctor or health care professional. This medicine can make you more sensitive to the sun. Keep out of the sun. If you cannot avoid being in the sun, wear protective clothing and use sunscreen. Do not use sun lamps or tanning beds/booths. What side effects may I notice from receiving this medicine? Side effects that you should report to your doctor or health care professional as soon as possible: -allergic reactions like skin rash, itching or hives, swelling of the  face, lips, or tongue -anxious -breathing problems -changes in vision -confusion -elevated mood, decreased need for sleep, racing thoughts, impulsive behavior -eye pain -fast, irregular heartbeat -feeling faint or lightheaded, falls -feeling agitated, angry, or irritable -fever with increased sweating -hallucination, loss of contact with reality -seizures -stiff muscles -suicidal thoughts or other mood changes -tingling, pain, or numbness in the feet or hands -trouble passing urine or change in the amount of urine -trouble sleeping -unusually weak or tired -vomiting -yellowing of the eyes or skin Side effects that usually do not require medical attention (report to your doctor or health care professional if they continue or are bothersome): -change in sex drive or performance -change in appetite or weight -constipation -dizziness -dry mouth -nausea -tired -tremors -upset stomach This list may not  describe all possible side effects. Call your doctor for medical advice about side effects. You may report side effects to FDA at 1-800-FDA-1088. Where should I keep my medicine? Keep out of the reach of children. Store at room temperature between 20 and 25 degrees C (68 and 77 degrees F). Throw away any unused medicine after the expiration date. NOTE: This sheet is a summary. It may not cover all possible information. If you have questions about this medicine, talk to your doctor, pharmacist, or health care provider.  2017 Elsevier/Gold Standard (2016-03-02 12:14:15)

## 2016-12-01 LAB — HEPATITIS C ANTIBODY: HCV AB: NEGATIVE

## 2016-12-01 LAB — HEMOGLOBIN A1C
Hgb A1c MFr Bld: 5.6 % (ref <5.7)
Mean Plasma Glucose: 114 mg/dL

## 2016-12-01 LAB — HIV ANTIBODY (ROUTINE TESTING W REFLEX): HIV 1&2 Ab, 4th Generation: NONREACTIVE

## 2016-12-01 LAB — MICROALBUMIN / CREATININE URINE RATIO
Creatinine, Urine: 205 mg/dL (ref 20–320)
Microalb Creat Ratio: 24 mcg/mg creat (ref <30)
Microalb, Ur: 5 mg/dL

## 2016-12-01 LAB — VITAMIN D 25 HYDROXY (VIT D DEFICIENCY, FRACTURES): VIT D 25 HYDROXY: 20 ng/mL — AB (ref 30–100)

## 2016-12-05 ENCOUNTER — Encounter: Payer: Self-pay | Admitting: Family Medicine

## 2016-12-10 ENCOUNTER — Ambulatory Visit: Payer: Self-pay

## 2016-12-10 ENCOUNTER — Ambulatory Visit: Payer: Self-pay | Attending: Family Medicine | Admitting: Licensed Clinical Social Worker

## 2016-12-10 DIAGNOSIS — F419 Anxiety disorder, unspecified: Secondary | ICD-10-CM | POA: Insufficient documentation

## 2016-12-10 DIAGNOSIS — F329 Major depressive disorder, single episode, unspecified: Secondary | ICD-10-CM | POA: Insufficient documentation

## 2016-12-10 DIAGNOSIS — F4323 Adjustment disorder with mixed anxiety and depressed mood: Secondary | ICD-10-CM

## 2016-12-10 NOTE — BH Specialist Note (Signed)
Session Start time: 2:10 PM   End Time: 2:55 PM Total Time:  45 minutes Type of Service: Behavioral Health - Individual/Family Interpreter: No.   Interpreter Name & Language: N/A # [facility]   SUBJECTIVE: Carla ConesDorothy Yu is a 56 y.o. female  Pt. was referred by FNP Hairston for:  anxiety and depression. Pt. reports the following symptoms/concerns: overwhelming feelings of sadness and worry, difficulty sleeping, low energy/motivation, inability to focus, social anxiety, withdrawn behavior, and irritability Duration of problem:  Ongoing Pt reports increase in symptoms after being laid off April 2017 Severity: severe Previous treatment: Pt stated that she participated in medication management "when younger". She stated that she is nervous about taking prescribed medication for mental health.   OBJECTIVE: Mood: Pleasant & Affect: Appropriate Risk of harm to self or others: Pt has hx of having non-command audio hallucinations. Pt denies plan or intent to self-harm and/or harm others Assessments administered: none administered  LIFE CONTEXT:  Family & Social: Pt resides with a friend who assists with emotional and financial support. She has eight children and have siblings who reside nearby School/ Work: Pt receives food stamps [phone]($[id number]) She was recently hired by [employer] Self-Care: No report of substance use Life changes: [place]. She was without a job for a year; however, was recently hired by [employer] What is important to pt/family (values): Family, Spirituality, Independence, Good Health   GOALS ADDRESSED:  Decrease symptoms of depression Decrease symptoms of anxiety Increase knowledge of coping skills  INTERVENTIONS: Solution Focused, Strength-based and Supportive   ASSESSMENT:  Pt currently experiencing depression and anxiety triggered by financial strain after being unemployed for an extended  period of time. She reports overwhelming feelings of sadness and worry, difficulty sleeping, low energy/motivation, inability to focus, social anxiety, withdrawn behavior, and irritability. Pt receives emotional and financial support from family and friends. Pt may benefit from psychotherapy and medication management. LCSWA educated pt on how psychosocial stressors can negatively impact one's physical and mental health. LCSWA discussed benefits of applying healthy coping skills to decrease symptoms and pt identified coping strategies to utilize on a daily basis. Pt stated that she has noticed a decrease in symptoms since starting her new job; however, she may be open to initiating therapy. Pt is not interested in medication management at this time due to concerns medication will make her overly tired and unable to perform job duties. LCSWA provided pt with resources for crisis resources and therapy.      PLAN: 1. F/U with behavioral health clinician: Pt was encouraged to contact LCSWA if symptoms worsen or fail to improve to [facility] 2. Behavioral Health meds: Pt was recently prescribed Elavil 3. Behavioral recommendations: LCSWA recommends that pt apply healthy coping skills discussed and initiate therapy. Pt is encouraged to schedule follow up appointment with LCSWA 4. Referral: Brief Counseling/Psychotherapy, [facility], Problem-solving teaching/coping strategies, Psychoeducation and Supportive Counseling 5. From scale of 1-10, how likely are you to follow plan: 8/10   Bridgett LarssonJasmine D Korie Brabson, MSW, LCSWA Clinical Social Work 12/11/16 2:37 PM  Warmhandoff: no

## 2016-12-12 ENCOUNTER — Telehealth: Payer: Self-pay

## 2016-12-12 ENCOUNTER — Other Ambulatory Visit: Payer: Self-pay | Admitting: Family Medicine

## 2016-12-12 DIAGNOSIS — M25511 Pain in right shoulder: Principal | ICD-10-CM

## 2016-12-12 DIAGNOSIS — G8929 Other chronic pain: Secondary | ICD-10-CM

## 2016-12-12 DIAGNOSIS — E559 Vitamin D deficiency, unspecified: Secondary | ICD-10-CM

## 2016-12-12 MED ORDER — ACETAMINOPHEN 500 MG PO TABS: 500.0000 mg | ORAL_TABLET | Freq: Three times a day (TID) | ORAL | 0 refills | Status: DC | PRN

## 2016-12-12 MED ORDER — CALCIUM 600-400 MG-UNIT PO CHEW: 1.0000 | CHEWABLE_TABLET | Freq: Every day | ORAL | 2 refills | Status: DC

## 2016-12-12 NOTE — Progress Notes (Signed)
I did not see any vitamin d supplement, she also ask for some pain medication for her shoulder pain that yall discuss on her last office last visit

## 2016-12-12 NOTE — Telephone Encounter (Signed)
CMA call to inform lab results  Patient verify DOB  Patient was aware and understood

## 2016-12-12 NOTE — Telephone Encounter (Signed)
-----   Message from Lizbeth BarkMandesia R Hairston, [place] sent at 12/11/2016  4:50 PM EST ----- -HIV is negative.  -Hepatitis C is negative.  -HgbA1c is normal . HgbA1c is used to screen for diabetes. -Vitamin D level was low. Vitamin D helps to keep bones strong. You were prescribed a vitamin-d supplement to increase your levels. -Thyroid function normal -Kidney function normal --Increase your dietary iron intake. Good sources of iron include dark green leafy vegetables, meats, beans, and iron fortified cereals.

## 2016-12-13 ENCOUNTER — Telehealth: Payer: Self-pay

## 2016-12-13 NOTE — Telephone Encounter (Signed)
CMA call to inform patient that her RX is ready to pick up  Patient was aware and understood

## 2016-12-13 NOTE — Telephone Encounter (Signed)
-----   Message from Lizbeth BarkMandesia R Hairston, FNP sent at 12/12/2016  5:43 PM EST ----- Medications sent. She reported taking OTC Tylenol to help control her pain symptoms.     ----- Message ----- From: Roger KillJoanna Marcella Charlson, CMA Sent: 12/12/2016  12:05 PM To: Lizbeth BarkMandesia R Hairston, FNP  I did not see any vitamin d supplement, she also ask for some pain medication for her shoulder pain that yall discuss on her last office last visit

## 2016-12-14 ENCOUNTER — Ambulatory Visit: Payer: Self-pay | Attending: Family Medicine | Admitting: Family Medicine

## 2016-12-14 VITALS — BP 136/91 | HR 87 | Temp 98.3°F | Resp 18 | Ht 61.0 in | Wt 205.2 lb

## 2016-12-14 DIAGNOSIS — Z803 Family history of malignant neoplasm of breast: Secondary | ICD-10-CM | POA: Insufficient documentation

## 2016-12-14 DIAGNOSIS — Z0000 Encounter for general adult medical examination without abnormal findings: Secondary | ICD-10-CM

## 2016-12-14 DIAGNOSIS — Z87440 Personal history of urinary (tract) infections: Secondary | ICD-10-CM | POA: Insufficient documentation

## 2016-12-14 DIAGNOSIS — Z760 Encounter for issue of repeat prescription: Secondary | ICD-10-CM | POA: Insufficient documentation

## 2016-12-14 DIAGNOSIS — Z1272 Encounter for screening for malignant neoplasm of vagina: Secondary | ICD-10-CM | POA: Insufficient documentation

## 2016-12-14 DIAGNOSIS — Z01419 Encounter for gynecological examination (general) (routine) without abnormal findings: Secondary | ICD-10-CM | POA: Insufficient documentation

## 2016-12-14 DIAGNOSIS — I10 Essential (primary) hypertension: Secondary | ICD-10-CM | POA: Insufficient documentation

## 2016-12-14 DIAGNOSIS — Z9114 Patient's other noncompliance with medication regimen: Secondary | ICD-10-CM | POA: Insufficient documentation

## 2016-12-14 DIAGNOSIS — Z113 Encounter for screening for infections with a predominantly sexual mode of transmission: Secondary | ICD-10-CM

## 2016-12-14 LAB — POCT URINALYSIS DIPSTICK
BILIRUBIN UA: NEGATIVE
GLUCOSE UA: NEGATIVE
Leukocytes, UA: NEGATIVE
Nitrite, UA: NEGATIVE
Protein, UA: 30
SPEC GRAV UA: 1.025
UROBILINOGEN UA: 0.2
pH, UA: 5.5

## 2016-12-14 MED ORDER — HYDROCHLOROTHIAZIDE 50 MG PO TABS: 50.0000 mg | ORAL_TABLET | Freq: Every day | ORAL | 2 refills | Status: DC

## 2016-12-14 MED ORDER — CALCIUM 600-400 MG-UNIT PO CHEW: 1.0000 | CHEWABLE_TABLET | Freq: Every day | ORAL | 2 refills | Status: DC

## 2016-12-14 MED ORDER — NITROFURANTOIN MONOHYD MACRO 100 MG PO CAPS: 100.0000 mg | ORAL_CAPSULE | Freq: Two times a day (BID) | ORAL | 0 refills | Status: DC

## 2016-12-14 NOTE — Patient Instructions (Addendum)
Schedule lab appointment for repeat urine test. Apply orange card. Take antibiotics for full course of therapy.   Nitrofurantoin tablets or capsules What is this medicine? NITROFURANTOIN (nye troe fyoor AN toyn) is an antibiotic. It is used to treat urinary tract infections. This medicine may be used for other purposes; ask your health care provider or pharmacist if you have questions. COMMON BRAND NAME(S): Macrobid, Macrodantin, Urotoin What should I tell my health care provider before I take this medicine? They need to know if you have any of these conditions: -anemia -diabetes -glucose-6-phosphate dehydrogenase deficiency -kidney disease -liver disease -lung disease -other chronic illness -an unusual or allergic reaction to nitrofurantoin, other antibiotics, other medicines, foods, dyes or preservatives -pregnant or trying to get pregnant -breast-feeding How should I use this medicine? Take this medicine by mouth with a glass of water. Follow the directions on the prescription label. Take this medicine with food or milk. Take your doses at regular intervals. Do not take your medicine more often than directed. Do not stop taking except on your doctor's advice. Talk to your pediatrician regarding the use of this medicine in children. While this drug may be prescribed for selected conditions, precautions do apply. Overdosage: If you think you have taken too much of this medicine contact a poison control center or emergency room at once. NOTE: This medicine is only for you. Do not share this medicine with others. What if I miss a dose? If you miss a dose, take it as soon as you can. If it is almost time for your next dose, take only that dose. Do not take double or extra doses. What may interact with this medicine? -antacids containing magnesium trisilicate -probenecid -quinolone antibiotics like ciprofloxacin, lomefloxacin, norfloxacin and ofloxacin -sulfinpyrazone This list may not  describe all possible interactions. Give your health care provider a list of all the medicines, herbs, non-prescription drugs, or dietary supplements you use. Also tell them if you smoke, drink alcohol, or use illegal drugs. Some items may interact with your medicine. What should I watch for while using this medicine? Tell your doctor or health care professional if your symptoms do not improve or if you get new symptoms. Drink several glasses of water a day. If you are taking this medicine for a long time, visit your doctor for regular checks on your progress. If you are diabetic, you may get a false positive result for sugar in your urine with certain brands of urine tests. Check with your doctor. What side effects may I notice from receiving this medicine? Side effects that you should report to your doctor or health care professional as soon as possible: -allergic reactions like skin rash or hives, swelling of the face, lips, or tongue -chest pain -cough -difficulty breathing -dizziness, drowsiness -fever or infection -joint aches or pains -pale or blue-tinted skin -redness, blistering, peeling or loosening of the skin, including inside the mouth -tingling, burning, pain, or numbness in hands or feet -unusual bleeding or bruising -unusually weak or tired -yellowing of eyes or skin Side effects that usually do not require medical attention (report to your doctor or health care professional if they continue or are bothersome): -dark urine -diarrhea -headache -loss of appetite -nausea or vomiting -temporary hair loss This list may not describe all possible side effects. Call your doctor for medical advice about side effects. You may report side effects to FDA at 1-800-FDA-1088. Where should I keep my medicine? Keep out of the reach of children. Store at room  temperature between 15 and 30 degrees C (59 and 86 degrees F). Protect from light. Throw away any unused medicine after the  expiration date. NOTE: This sheet is a summary. It may not cover all possible information. If you have questions about this medicine, talk to your doctor, pharmacist, or health care provider.  2018 Elsevier/Gold Standard (2008-04-21 15:56:47)  Pap Test Why am I having this test? A pap test is sometimes called a pap smear. It is a screening test that is used to check for signs of cancer of the vagina, cervix, and uterus. The test can also identify the presence of infection or precancerous changes. Your health care provider will likely recommend you have this test done on a regular basis. This test may be done:  Every 3 years, starting at age 56.  Every 5 years, in combination with testing for the presence of human papillomavirus (HPV).  More or less often depending on other medical conditions. What kind of sample is taken? Using a small cotton swab, plastic spatula, or brush, your health care provider will collect a sample of cells from the surface of your cervix. Your cervix is the opening to your uterus, also called a womb. Secretions from the cervix and vagina may also be collected. How do I prepare for this test?  Be aware of where you are in your menstrual cycle. You may be asked to reschedule the test if you are menstruating on the day of the test.  You may need to reschedule if you have a known vaginal infection on the day of the test.  You may be asked to avoid douching or taking a bath the day before or the day of the test.  Some medicines can cause abnormal test results, such as digitalis and tetracycline. Talk with your health care provider before your test if you take one of these medicines. What do the results mean? Abnormal test results may indicate a number of health conditions. These may include:  Cancer. Although pap test results cannot be used to diagnose cancer of the cervix, vagina, or uterus, they may suggest the possibility of cancer. Further tests would be required  to determine if cancer is present.  Sexually transmitted disease.  Fungal infection.  Parasite infection.  Herpes infection.  A condition causing or contributing to infertility. It is your responsibility to obtain your test results. Ask the lab or department performing the test when and how you will get your results. Contact your health care provider to discuss any questions you have about your results. Talk with your health care provider to discuss your results, treatment options, and if necessary, the need for more tests. Talk with your health care provider if you have any questions about your results. This information is not intended to replace advice given to you by your health care provider. Make sure you discuss any questions you have with your health care provider. Document Released: 12/22/2002 Document Revised: 06/06/2016 Document Reviewed: 02/22/2014 Elsevier Interactive Patient Education  2017 Elsevier Inc.   Breast Self-Awareness Breast self-awareness means being familiar with how your breasts look and feel. It involves checking your breasts regularly and reporting any changes to your health care provider. Practicing breast self-awareness is important. A change in your breasts can be a sign of a serious medical problem. Being familiar with how your breasts look and feel allows you to find any problems early, when treatment is more likely to be successful. All women should practice breast self-awareness, including women who  have had breast implants. How to do a breast self-exam One way to learn what is normal for your breasts and whether your breasts are changing is to do a breast self-exam. To do a breast self-exam: Look for Changes   1. Remove all the clothing above your waist. 2. Stand in front of a mirror in a room with good lighting. 3. Put your hands on your hips. 4. Push your hands firmly downward. 5. Compare your breasts in the mirror. Look for differences between them  (asymmetry), such as:  Differences in shape.  Differences in size.  Puckers, dips, and bumps in one breast and not the other. 6. Look at each breast for changes in your skin, such as:  Redness.  Scaly areas. 7. Look for changes in your nipples, such as:  Discharge.  Bleeding.  Dimpling.  Redness.  A change in position. Feel for Changes   Carefully feel your breasts for lumps and changes. It is best to do this while lying on your back on the floor and again while sitting or standing in the shower or tub with soapy water on your skin. Feel each breast in the following way:  Place the arm on the side of the breast you are examining above your head.  Feel your breast with the other hand.  Start in the nipple area and make  inch (2 cm) overlapping circles to feel your breast. Use the pads of your three middle fingers to do this. Apply light pressure, then medium pressure, then firm pressure. The light pressure will allow you to feel the tissue closest to the skin. The medium pressure will allow you to feel the tissue that is a little deeper. The firm pressure will allow you to feel the tissue close to the ribs.  Continue the overlapping circles, moving downward over the breast until you feel your ribs below your breast.  Move one finger-width toward the center of the body. Continue to use the  inch (2 cm) overlapping circles to feel your breast as you move slowly up toward your collarbone.  Continue the up and down exam using all three pressures until you reach your armpit. Write Down What You Find   Write down what is normal for each breast and any changes that you find. Keep a written record with breast changes or normal findings for each breast. By writing this information down, you do not need to depend only on memory for size, tenderness, or location. Write down where you are in your menstrual cycle, if you are still menstruating. If you are having trouble noticing  differences in your breasts, do not get discouraged. With time you will become more familiar with the variations in your breasts and more comfortable with the exam. How often should I examine my breasts? Examine your breasts every month. If you are breastfeeding, the best time to examine your breasts is after a feeding or after using a breast pump. If you menstruate, the best time to examine your breasts is 5-7 days after your period is over. During your period, your breasts are lumpier, and it may be more difficult to notice changes. When should I see my health care provider? See your health care provider if you notice:  A change in shape or size of your breasts or nipples.  A change in the skin of your breast or nipples, such as a reddened or scaly area.  Unusual discharge from your nipples.  A lump or thick area that  was not there before.  Pain in your breasts.  Anything that concerns you. This information is not intended to replace advice given to you by your health care provider. Make sure you discuss any questions you have with your health care provider. Document Released: 10/01/2005 Document Revised: 03/08/2016 Document Reviewed: 08/21/2015 Elsevier Interactive Patient Education  2017 [employer].

## 2016-12-14 NOTE — Progress Notes (Signed)
Patient is here for F/up BP PAP & Uti  Patient complain about right  Shoulder pain  Patient has not taking any meds today & has not eaten today

## 2016-12-14 NOTE — Progress Notes (Signed)
Subjective:  Patient ID: Carla Yu, female    DOB: 1961/05/08  Age: 56 y.o. MRN: [id number]  CC: Establish Care   HPI [name] presents for   Well woman visit: No vaginal lesions, discharge, or dysuria. Family history of breast cancer mother Grandmother. Request STD testing. Reports 1 sexual partner within the last 3 months.  F/u UTI : Abnormal urine dipstick. Non-adherance with medication. Reports taking antibiotic for only two days due to symptoms improvement.  HTN: BP still elevated in office today. Denies CP, SOB, and BLE.    Outpatient Medications Prior to Visit  Medication Sig Dispense Refill  . acetaminophen (TYLENOL) 500 MG tablet Take 1 tablet (500 mg total) by mouth every 8 (eight) hours as needed for mild pain or moderate pain. 90 tablet 0  . amitriptyline (ELAVIL) 25 MG tablet Take 1 tablet (25 mg total) by mouth at bedtime. 30 tablet 1  . Calcium 600-400 MG-UNIT CHEW Chew 1 tablet by mouth daily. 60 tablet 2  . hydrochlorothiazide (HYDRODIURIL) 25 MG tablet Take 1 tablet (25 mg total) by mouth daily. 30 tablet 2  . nitrofurantoin, macrocrystal-monohydrate, (MACROBID) 100 MG capsule Take 1 capsule (100 mg total) by mouth 2 (two) times daily. For 5 days. Take with food. 10 capsule 0   No facility-administered medications prior to visit.     ROS Review of Systems  Respiratory: Negative.   Cardiovascular: Negative.   Gastrointestinal: Negative.   Genitourinary: Negative.         Objective:  BP (!) 136/91 (BP Location: Left Arm, Patient Position: Sitting, Cuff Size: Normal)   Pulse 87   Temp 98.3 F (36.8 C) (Oral)   Resp 18   Ht 5\' 1"  (1.549 m)   Wt 205 lb 3.2 oz (93.1 kg)   SpO2 100%   BMI 38.77 kg/m   BP/Weight 12/14/2016 11/30/2016  Systolic BP 136 149  Diastolic BP 91 111  Wt. (Lbs) 205.2 203  BMI 38.77 38.36     Physical Exam  Cardiovascular: Normal rate, regular rhythm, normal heart sounds and intact distal pulses.     Pulmonary/Chest: Effort normal and breath sounds normal. Right breast exhibits no mass. Left breast exhibits no mass.  Abdominal: Soft. Bowel sounds are normal.  Genitourinary: Vagina normal.  Skin: Skin is warm and dry.  Nursing note and vitals reviewed.   Assessment & Plan:   Problem List Items Addressed This Visit      Cardiovascular and Mediastinum   Essential hypertension   -If BP recheck at nurse visit is not at goal , may add amlodipine 2.5 mg QD and repeat nurse BP check      again in 2 weeks.   Relevant Medications   hydrochlorothiazide (HYDRODIURIL) 50 MG tablet    Other Visit Diagnoses    Well woman exam with routine gynecological exam    -  Primary   Relevant Orders   Cytology - PAP [place] (Completed)   Screening for STDs (sexually transmitted diseases)       Relevant Orders   STD Panel (HBSAG,HIV,RPR) (Completed)   HSV(herpes simplex vrs) 1+2 ab-IgG (Completed)   Cervicovaginal ancillary only (Completed)   History of UTI       Relevant Orders   POCT urinalysis dipstick (Completed)   Urinalysis   Healthcare maintenance       Relevant Orders   MM Digital Screening   Medication refill       Relevant Medications   Calcium 600-400 MG-UNIT CHEW  History of medication noncompliance          Meds ordered this encounter  Medications  . hydrochlorothiazide (HYDRODIURIL) 50 MG tablet    Sig: Take 1 tablet (50 mg total) by mouth daily.    Dispense:  30 tablet    Refill:  2    Order Specific Question:   Supervising Provider    Answer:   Quentin AngstJEGEDE, OLUGBEMIGA E [id number]  . Calcium 600-400 MG-UNIT CHEW    Sig: Chew 1 tablet by mouth daily.    Dispense:  60 tablet    Refill:  2    Order Specific Question:   Supervising Provider    Answer:   Quentin AngstJEGEDE, OLUGBEMIGA E [id number]  . DISCONTD: nitrofurantoin, macrocrystal-monohydrate, (MACROBID) 100 MG capsule    Sig: Take 1 capsule (100 mg total) by mouth 2 (two) times daily. For 5 days. Take with food.    Dispense:   10 capsule    Refill:  0    Order Specific Question:   Supervising Provider    Answer:   Quentin AngstJEGEDE, OLUGBEMIGA E [[id number]]    Follow-up: Return in about 2 weeks (around 12/28/2016) for BP check with [facility].   Lizbeth BarkMandesia R Yuniel Blaney FNP

## 2016-12-15 LAB — STD PANEL
HIV 1&2 Ab, 4th Generation: NONREACTIVE
Hepatitis B Surface Ag: NEGATIVE

## 2016-12-17 ENCOUNTER — Telehealth: Payer: Self-pay | Admitting: Family Medicine

## 2016-12-17 ENCOUNTER — Other Ambulatory Visit: Payer: Self-pay | Admitting: Family Medicine

## 2016-12-17 DIAGNOSIS — Z87440 Personal history of urinary (tract) infections: Secondary | ICD-10-CM

## 2016-12-17 LAB — CYTOLOGY - PAP
Diagnosis: NEGATIVE
HPV (WINDOPATH): NOT DETECTED

## 2016-12-17 LAB — CERVICOVAGINAL ANCILLARY ONLY
Bacterial vaginitis: NEGATIVE
CANDIDA VAGINITIS: NEGATIVE
CHLAMYDIA, DNA PROBE: NEGATIVE
NEISSERIA GONORRHEA: NEGATIVE
TRICH (WINDOWPATH): NEGATIVE

## 2016-12-17 LAB — HSV(HERPES SIMPLEX VRS) I + II AB-IGG
HSV 1 Glycoprotein G Ab, IgG: 55.6 Index — ABNORMAL HIGH (ref <0.90)
HSV 2 Glycoprotein G Ab, IgG: 10.7 Index — ABNORMAL HIGH (ref <0.90)

## 2016-12-17 MED ORDER — CIPROFLOXACIN HCL 250 MG PO TABS: 250.0000 mg | ORAL_TABLET | Freq: Two times a day (BID) | ORAL | 0 refills | Status: DC

## 2016-12-17 MED FILL — CIPROFLOXACIN HCL 250 MG TA: 250 | 7 days supply | Qty: 14 | Fill #0

## 2016-12-17 MED FILL — HYDROCHLOROTHIAZIDE 25 MG T: 25 | 30 days supply | Qty: 60 | Fill #0

## 2016-12-17 NOTE — Telephone Encounter (Signed)
CMA call to inform patient that RX is already change & can pick up at the pharmacy   Patient did not answer but left a VM stating the information & if have any questions just to call me back

## 2016-12-17 NOTE — Telephone Encounter (Signed)
Please let patient know medication has been changed to ciprofloxacin. She should take antibiotics for the full course even if she feels an improvement in symptoms.

## 2016-12-17 NOTE — Telephone Encounter (Signed)
Patient called the office to speak with PCP regarding her medication. Pt can't afford nitrofurantoin, macrocrystal-monohydrate, (MACROBID) 100 MG capsule. Pt would like to know if another prescription can be called in that cost less. Please advice?

## 2016-12-17 NOTE — Telephone Encounter (Signed)
Patient called the office to speak with PCP regarding her medication. Pt can't afford nitrofurantoin, macrocrystal-monohydrate, (MACROBID) 100 MG capsule. Pt would like to know if another prescription can be called in that cost less.   Thank you.

## 2016-12-19 ENCOUNTER — Ambulatory Visit: Payer: Medicaid Other | Attending: Family Medicine

## 2016-12-19 DIAGNOSIS — Z87440 Personal history of urinary (tract) infections: Secondary | ICD-10-CM

## 2016-12-19 LAB — CERVICOVAGINAL ANCILLARY ONLY: Herpes: NEGATIVE

## 2016-12-19 NOTE — Progress Notes (Signed)
Patient here for lab visit only 

## 2016-12-20 LAB — URINALYSIS
BILIRUBIN URINE: NEGATIVE
GLUCOSE, UA: NEGATIVE
KETONES UR: NEGATIVE
Leukocytes, UA: NEGATIVE
Nitrite: NEGATIVE
Specific Gravity, Urine: 1.023 (ref 1.001–1.035)
pH: 5.5 (ref 5.0–8.0)

## 2016-12-24 ENCOUNTER — Other Ambulatory Visit: Payer: Self-pay | Admitting: Family Medicine

## 2016-12-24 DIAGNOSIS — R768 Other specified abnormal immunological findings in serum: Secondary | ICD-10-CM

## 2016-12-25 ENCOUNTER — Other Ambulatory Visit: Payer: Self-pay | Admitting: Family Medicine

## 2016-12-25 ENCOUNTER — Telehealth: Payer: Self-pay

## 2016-12-25 DIAGNOSIS — R809 Proteinuria, unspecified: Secondary | ICD-10-CM

## 2016-12-25 DIAGNOSIS — R3121 Asymptomatic microscopic hematuria: Secondary | ICD-10-CM

## 2016-12-25 DIAGNOSIS — Z87440 Personal history of urinary (tract) infections: Secondary | ICD-10-CM

## 2016-12-25 NOTE — Telephone Encounter (Signed)
CMA call to go over lab results  Patient Verify DOB  Patient was aware and understood   

## 2016-12-25 NOTE — Telephone Encounter (Signed)
-----   Message from Lizbeth BarkMandesia R Hairston, FNP sent at 12/24/2016 11:40 AM EDT ----- -HIV, Syphilis, or Hepatitis.  -Pap smear showed no lesions or malignancy. -Gonorrhea, Chlamydia, BV, Yeast, and Trichomonas were all negative. -Herpes type 1 which is primarily responsible for cold sores is positive. When you have cold sores do not kiss anyone, share utensils, or have oral sex. 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 Herpes cannot be cured. To decrease the risk of spreading of herpes use a condom every time you have sex, do not have sex when you have symptoms (blisters, sores). Acyclovir can be taken for symptoms if outbreaks occur.  -Herpes symptoms usually go away and come back. A return of symptoms is called an "outbreak." Outbreaks usually include blisters and open sores in the genital area.

## 2016-12-26 ENCOUNTER — Telehealth: Payer: Self-pay

## 2016-12-26 NOTE — Telephone Encounter (Signed)
CMA call to inform what her pcp recommended   Patient was aware and understood

## 2016-12-26 NOTE — Telephone Encounter (Signed)
-----   Message from Lizbeth BarkMandesia R Hairston, FNP sent at 12/25/2016  9:23 PM EDT ----- Recommend repeat urine labs after course of antibiotics is complete. Recommend scheduling a follow up urinalysis with lab. Continue to take your blood pressure medications.

## 2016-12-28 ENCOUNTER — Ambulatory Visit: Payer: Self-pay | Attending: Family Medicine | Admitting: *Deleted

## 2016-12-28 ENCOUNTER — Other Ambulatory Visit: Payer: Self-pay | Admitting: Family Medicine

## 2016-12-28 VITALS — BP 142/90 | HR 79

## 2016-12-28 DIAGNOSIS — I10 Essential (primary) hypertension: Secondary | ICD-10-CM | POA: Insufficient documentation

## 2016-12-28 MED ORDER — AMLODIPINE BESYLATE 2.5 MG PO TABS: 2.5000 mg | ORAL_TABLET | Freq: Every day | ORAL | 2 refills | Status: DC

## 2016-12-28 NOTE — Telephone Encounter (Signed)
Pt. Also states that she went to the pharmacy and they do not have the Rx that her PCP wanted her to take. Please f/u

## 2016-12-28 NOTE — Telephone Encounter (Signed)
Yes she was talking about the acyclovir but I told her if she had any outbreaks or symptoms that the provider could prescribe her that, I spoke with  the patient & she denies the outbreak or any other symptoms while I was telling her that the phone call got disconnect it.

## 2016-12-28 NOTE — Telephone Encounter (Signed)
Acyclovir she can request if she is having outbreak or symptoms.

## 2016-12-28 NOTE — Telephone Encounter (Signed)
She denied the outbreak but while we was talking it sound like they hang up tried to call again & hang up again.

## 2016-12-28 NOTE — Telephone Encounter (Signed)
Please ask patient what medication is she referring to. Order for cipro was placed 3/5 for UTI. If she is requesting acyclovir I can send prescription request to pharmacy.

## 2016-12-28 NOTE — Progress Notes (Signed)
Pt here for f/u BP check.Pt denies chest pain, SOB, HA, new vison concerns, or generalized swelling.  Has been taking medications as prescribed.  Blood pressure taken manually while patient is sitting.  BP reading in bilateral arms : 142/90. Nurse visit will be routed to provider.Guy Francoravia Alexandar Weisenberger, RN, BSN

## 2016-12-28 NOTE — Telephone Encounter (Signed)
CMA call to get more information of what kind of prescription the patient its talking about that she suppose to pick up at pharmacy at the middle of the call the phone got disconnected   CMA call again , patient answer the call but no one speaking & than hang up

## 2016-12-28 NOTE — Telephone Encounter (Signed)
Pt. Also states that she went to the pharmacy and they do not have the Rx that her PCP wanted her to take. Please f/u

## 2016-12-28 NOTE — Progress Notes (Signed)
Per provider note amlodipine 2.5 mg QD should be added for better BP control. Please inform patient and recommend for f/u BP recheck in 2 weeks.

## 2016-12-28 NOTE — Telephone Encounter (Signed)
Pt. Came into facility requesting to speak with her PCP regarding her lab results. Pt. Would like to speak with her PCP. Please f/u

## 2016-12-31 MED FILL — AMLODIPINE BESYLATE 2.5 MG: 2.5 | 30 days supply | Qty: 30 | Fill #0

## 2017-01-02 ENCOUNTER — Telehealth: Payer: Self-pay | Admitting: *Deleted

## 2017-01-02 NOTE — Telephone Encounter (Signed)
From what I have been told in the past our pharmacy has a free 30 day supply medication program available for first time recipients who may be having trouble affording their medications. I will route to clinical pharmacist to clarify.

## 2017-01-02 NOTE — Telephone Encounter (Addendum)
Pt admits to a lot of stressors: health, lab results, BP, medications and financial concerns. She states she is unable to afford medications because she is not working consistently. Inform  patient of lab results. Educated on transmission of HSV I and II.  Pt states she is having a hard time in every way. Patient states she is not working. Unable to afford medication to take for BP and health. She is out of everything and unable to get meds.

## 2017-01-03 NOTE — Telephone Encounter (Signed)
Yes, our pharmacy will offer a free 30 day supply to patients who cannot afford their medications but it is only once per lifetime so she will need to get financial assistance to hopefully help her to get her medications after the first fill.

## 2017-01-07 ENCOUNTER — Other Ambulatory Visit: Payer: Self-pay | Admitting: Family Medicine

## 2017-01-07 NOTE — Telephone Encounter (Signed)
LCSWA contacted pt to address psychosocial stressors. Pt was informed of the PASS program and benefits of applying for Financial Counseling. Pt reported that she was unable to provide complete documentation to Deisy at last appointment. She agreed to schedule another appointment to assist with financial strain of paying for medical bills.

## 2017-01-09 ENCOUNTER — Telehealth: Payer: Self-pay

## 2017-01-09 NOTE — Telephone Encounter (Signed)
CMA call to inform patient that her pcp added some new BP medication  Patient was aware and understood

## 2017-01-14 ENCOUNTER — Ambulatory Visit: Payer: Medicaid Other

## 2017-01-18 ENCOUNTER — Ambulatory Visit: Payer: Medicaid Other

## 2017-01-28 ENCOUNTER — Encounter: Payer: Self-pay | Admitting: Family Medicine

## 2017-03-17 ENCOUNTER — Emergency Department (HOSPITAL_COMMUNITY)
Admission: EM | Admit: 2017-03-17 | Discharge: 2017-03-17 | Disposition: A | Discharge status: Home / Self Care | Payer: Medicaid Other | Attending: Emergency Medicine | Admitting: Emergency Medicine

## 2017-03-17 ENCOUNTER — Encounter (HOSPITAL_COMMUNITY): Payer: Self-pay

## 2017-03-17 ENCOUNTER — Emergency Department (HOSPITAL_COMMUNITY): Payer: Medicaid Other

## 2017-03-17 DIAGNOSIS — M79605 Pain in left leg: Secondary | ICD-10-CM | POA: Insufficient documentation

## 2017-03-17 DIAGNOSIS — Z79899 Other long term (current) drug therapy: Secondary | ICD-10-CM | POA: Insufficient documentation

## 2017-03-17 DIAGNOSIS — I10 Essential (primary) hypertension: Secondary | ICD-10-CM | POA: Insufficient documentation

## 2017-03-17 HISTORY — DX: Anemia, unspecified: D64.9

## 2017-03-17 NOTE — Discharge Instructions (Signed)
Please read instructions below. Apply ice to your leg for 20 minutes at a time. You can take tylenol every 6 hours as needed for pain. Schedule an appointment with your primary care provider if symptoms persist. Return to the ER for new or concerning symptoms.
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
## 2017-03-17 NOTE — ED Notes (Signed)
Declined W/C at D/C and was escorted to lobby by RN. 
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
## 2017-04-22 ENCOUNTER — Ambulatory Visit: Payer: Self-pay | Attending: Family Medicine | Admitting: Family Medicine

## 2017-04-22 ENCOUNTER — Encounter: Payer: Self-pay | Admitting: Family Medicine

## 2017-04-22 VITALS — BP 147/96 | HR 74 | Temp 98.2°F | Resp 18 | Ht 61.0 in | Wt 193.4 lb

## 2017-04-22 DIAGNOSIS — Z1211 Encounter for screening for malignant neoplasm of colon: Secondary | ICD-10-CM

## 2017-04-22 DIAGNOSIS — S61219A Laceration without foreign body of unspecified finger without damage to nail, initial encounter: Secondary | ICD-10-CM | POA: Insufficient documentation

## 2017-04-22 DIAGNOSIS — M7989 Other specified soft tissue disorders: Secondary | ICD-10-CM | POA: Insufficient documentation

## 2017-04-22 DIAGNOSIS — Z1231 Encounter for screening mammogram for malignant neoplasm of breast: Secondary | ICD-10-CM

## 2017-04-22 DIAGNOSIS — Z760 Encounter for issue of repeat prescription: Secondary | ICD-10-CM

## 2017-04-22 DIAGNOSIS — Z79899 Other long term (current) drug therapy: Secondary | ICD-10-CM | POA: Insufficient documentation

## 2017-04-22 DIAGNOSIS — Z1239 Encounter for other screening for malignant neoplasm of breast: Secondary | ICD-10-CM

## 2017-04-22 DIAGNOSIS — S61219D Laceration without foreign body of unspecified finger without damage to nail, subsequent encounter: Secondary | ICD-10-CM

## 2017-04-22 DIAGNOSIS — B353 Tinea pedis: Secondary | ICD-10-CM | POA: Insufficient documentation

## 2017-04-22 DIAGNOSIS — X58XXXA Exposure to other specified factors, initial encounter: Secondary | ICD-10-CM | POA: Insufficient documentation

## 2017-04-22 DIAGNOSIS — I10 Essential (primary) hypertension: Secondary | ICD-10-CM | POA: Insufficient documentation

## 2017-04-22 MED ORDER — TERBINAFINE HCL 250 MG PO TABS: 250.0000 mg | ORAL_TABLET | Freq: Every day | ORAL | 0 refills | Status: DC

## 2017-04-22 MED ORDER — HYDROCHLOROTHIAZIDE 25 MG PO TABS: 25.0000 mg | ORAL_TABLET | Freq: Every day | ORAL | 0 refills | Status: DC

## 2017-04-22 MED ORDER — TERBINAFINE HCL 1 % EX CREA: 1.0000 "application " | TOPICAL_CREAM | Freq: Two times a day (BID) | CUTANEOUS | 0 refills | Status: DC

## 2017-04-22 MED ORDER — AMITRIPTYLINE HCL 25 MG PO TABS: 25.0000 mg | ORAL_TABLET | Freq: Every day | ORAL | 1 refills | Status: DC

## 2017-04-22 MED ORDER — AMLODIPINE BESYLATE 10 MG PO TABS: 10.0000 mg | ORAL_TABLET | Freq: Every day | ORAL | 0 refills | Status: DC

## 2017-04-22 MED ORDER — TRAMADOL HCL 50 MG PO TABS: 50.0000 mg | ORAL_TABLET | Freq: Three times a day (TID) | ORAL | 0 refills | Status: DC | PRN

## 2017-04-22 MED ORDER — ACETAMINOPHEN 500 MG PO TABS: 1000.0000 mg | ORAL_TABLET | Freq: Four times a day (QID) | ORAL | 0 refills | Status: DC | PRN

## 2017-04-22 MED FILL — HYDROCHLOROTHIAZIDE 25 MG T: 25 | 30 days supply | Qty: 30 | Fill #0

## 2017-04-22 MED FILL — TERBINAFINE HCL 250 MG TAB: 250 | 14 days supply | Qty: 14 | Fill #0

## 2017-04-22 MED FILL — AMITRIPTYLINE HCL 25 MG TAB: 25 | 30 days supply | Qty: 30 | Fill #0

## 2017-04-22 MED FILL — ?AMLODIPINE BESYLATE 10 MG: 10 | 30 days supply | Qty: 30 | Fill #0

## 2017-04-22 NOTE — Progress Notes (Signed)
Patient is here for f/up HTN  Patient been without BP medication for 2-3 months

## 2017-04-22 NOTE — Progress Notes (Signed)
Subjective:  Patient ID: Carla Yu, female    DOB: September 17, 1961  Age: 56 y.o. MRN: [id number]  CC: Hypertension   HPI Marquite Attwood presents for hypertension f/u. She is not exercising and is not adherent to low salt diet.  She does not check BP at home. Cardiac symptoms none. Patient denies chest pain, chest pressure/discomfort, claudication, dyspnea, near-syncope, palpitations and syncope.  Cardiovascular risk factors: hypertension, obesity (BMI >= 30 kg/m2) and sedentary lifestyle. Use of agents associated with hypertension: none. History of target organ damage: none. She reports nonadheranc with antihypertensive medications for two months She complains of swelling of theleft leg(s). Symptoms have been present for 1 month(s) She has had similar problems in the past with recent ED visit in June. The patient is able to ambulate. Risk factors for hypercoagulable state include:  obesity and varicose veins. Patient complains of probable tinea. Lesion is located on the bilateral feet foot. Symptoms include patches of scaly skin . Symptoms have been ongoing for about several months. She denies taking anything for symptoms. History of finger laceration while at work earlier today. She reports going to urgent care for evaluation and treatment.   Outpatient Medications Prior to Visit  Medication Sig Dispense Refill  . Calcium 600-400 MG-UNIT CHEW Chew 1 tablet by mouth daily. 60 tablet 2  . ciprofloxacin (CIPRO) 250 MG tablet Take 1 tablet (250 mg total) by mouth 2 (two) times daily. 14 tablet 0  . acetaminophen (TYLENOL) 500 MG tablet Take 1 tablet (500 mg total) by mouth every 8 (eight) hours as needed for mild pain or moderate pain. 90 tablet 0  . amitriptyline (ELAVIL) 25 MG tablet Take 1 tablet (25 mg total) by mouth at bedtime. 30 tablet 1  . amLODipine (NORVASC) 2.5 MG tablet Take 1 tablet (2.5 mg total) by mouth daily. 30 tablet 2  . hydrochlorothiazide (HYDRODIURIL) 50 MG tablet Take  1 tablet (50 mg total) by mouth daily. 30 tablet 2   No facility-administered medications prior to visit.     ROS Review of Systems  Constitutional: Negative.   Respiratory: Negative.   Cardiovascular: Positive for leg swelling (left).  Gastrointestinal: Negative.   Musculoskeletal: Negative.   Skin: Positive for rash.  Psychiatric/Behavioral: Negative for suicidal ideas.   Objective:  BP (!) 147/96 (BP Location: Left Arm, Patient Position: Sitting, Cuff Size: Normal)   Pulse 74   Temp 98.2 F (36.8 C) (Oral)   Resp 18   Ht 5\' 1"  (1.549 m)   Wt 193 lb 6.4 oz (87.7 kg)   SpO2 99%   BMI 36.54 kg/m   BP/Weight 04/22/2017 03/17/2017 12/28/2016  Systolic BP 147 177 142  Diastolic BP 96 111 90  Wt. (Lbs) 193.4 208 -  BMI 36.54 39.3 -    Physical Exam  Constitutional: She appears well-developed and well-nourished.  Eyes: Conjunctivae are normal. Pupils are equal, round, and reactive to light.  Neck: No JVD present.  Cardiovascular: Normal rate, regular rhythm, normal heart sounds and intact distal pulses.   Pulses:      Radial pulses are 2+ on the left side.       Dorsalis pedis pulses are 2+ on the right side, and 2+ on the left side.  Pulmonary/Chest: Effort normal and breath sounds normal.  Abdominal: Soft. Bowel sounds are normal.  Musculoskeletal:       Left lower leg: She exhibits swelling (non-pitting; varicose veins present). She exhibits no tenderness (negative Homan's sign).  Skin: Skin is warm and  dry. Rash (bilateral scaly rash to plantar surface of feet and intertriginous areas between toes) noted.  Psychiatric: Her mood appears anxious. She expresses no homicidal and no suicidal ideation. She expresses no suicidal plans and no homicidal plans.  Nursing note and vitals reviewed.   Assessment & Plan:   Problem List Items Addressed This Visit      Cardiovascular and Mediastinum   Essential hypertension - Primary   Schedule BP recheck in 2 weeks with clinic RN     If BP is greater than 90/60 (MAP 65 or greater) but not less than 130/80 may add losartan 25 mg QD   and recheck in another 2 weeks with clinic RN   Follow up with PCP in 3 months.   Relevant Medications   amLODipine (NORVASC) 10 MG tablet   hydrochlorothiazide (HYDRODIURIL) 25 MG tablet    Other Visit Diagnoses    Tinea pedis of both feet       Relevant Medications   terbinafine (LAMISIL AT) 1 % cream   terbinafine (LAMISIL) 250 MG tablet   Left leg swelling       Relevant Orders   VAS [place] LOWER EXTREMITY VENOUS (DVT)   Finger laceration, subsequent encounter       Relevant Medications   traMADol (ULTRAM) 50 MG tablet   Screening for breast cancer       Relevant Orders   MM SCREENING BREAST TOMO BILATERAL   Screening for colon cancer       Relevant Orders   Ambulatory referral to Gastroenterology   Medication refill       Relevant Medications   amitriptyline (ELAVIL) 25 MG tablet      Meds ordered this encounter  Medications  . amLODipine (NORVASC) 10 MG tablet    Sig: Take 1 tablet (10 mg total) by mouth daily.    Dispense:  90 tablet    Refill:  0    Order Specific Question:   Supervising Provider    Answer:   Quentin Angst [id number]  . hydrochlorothiazide (HYDRODIURIL) 25 MG tablet    Sig: Take 1 tablet (25 mg total) by mouth daily.    Dispense:  90 tablet    Refill:  0    Order Specific Question:   Supervising Provider    Answer:   Quentin Angst [id number]  . acetaminophen (TYLENOL) 500 MG tablet    Sig: Take 2 tablets (1,000 mg total) by mouth every 6 (six) hours as needed for moderate pain.    Dispense:  30 tablet    Refill:  0    Order Specific Question:   Supervising Provider    Answer:   Quentin Angst [id number]  . terbinafine (LAMISIL AT) 1 % cream    Sig: Apply 1 application topically 2 (two) times daily.    Dispense:  30 g    Refill:  0    Order Specific Question:   Supervising Provider    Answer:   Quentin Angst  [id number]  . terbinafine (LAMISIL) 250 MG tablet    Sig: Take 1 tablet (250 mg total) by mouth daily. For 14 days.    Dispense:  14 tablet    Refill:  0    Order Specific Question:   Supervising Provider    Answer:   Quentin Angst [id number]  . traMADol (ULTRAM) 50 MG tablet    Sig: Take 1 tablet (50 mg total) by mouth every 8 (eight) hours as needed for severe  pain.    Dispense:  10 tablet    Refill:  0    No refills.    Order Specific Question:   Supervising Provider    Answer:   Quentin AngstJEGEDE, OLUGBEMIGA E [id number]  . amitriptyline (ELAVIL) 25 MG tablet    Sig: Take 1 tablet (25 mg total) by mouth at bedtime.    Dispense:  30 tablet    Refill:  1    Order Specific Question:   Supervising Provider    Answer:   Quentin AngstJEGEDE, OLUGBEMIGA E [id number]    Follow-up: Return in about 2 weeks (around 05/06/2017) for BP check with clinic RN.    Lizbeth BarkMandesia R Lera Gaines FNP

## 2017-04-22 NOTE — Patient Instructions (Addendum)
Athlete's Foot Athlete's foot (tinea pedis) is a fungal infection of the skin on the feet. It often occurs on the skin that is between or underneath the toes. It can also occur on the soles of the feet. The infection can spread from person to person (is contagious). Follow these instructions at home:  Apply or take over-the-counter and prescription medicines only as told by your doctor.  Keep all follow-up visits as told by your doctor. This is important.  Do not scratch your feet.  Keep your feet dry: ? Wear cotton or wool socks. Change your socks every day or if they become wet. ? Wear shoes that allow air to move around, such as sandals or canvas tennis shoes.  Wash and dry your feet: ? Every day or as told by your doctor. ? After exercising. ? Including the area between your toes.  Wear sandals in wet areas, such as locker rooms and shared showers.  Do not share any of these items: ? Towels. ? Nail clippers. ? Other personal items that touch your feet.  If you have diabetes, keep your blood sugar under control. Contact a doctor if:  You have a fever.  You have swelling, soreness, warmth, or redness in your foot.  You are not getting better with treatment.  Your symptoms get worse.  You have new symptoms. This information is not intended to replace advice given to you by your health care provider. Make sure you discuss any questions you have with your health care provider. Document Released: 03/19/2008 Document Revised: 03/08/2016 Document Reviewed: 04/04/2015 Elsevier Interactive Patient Education  2018 Elsevier Inc.  

## 2017-04-23 MED FILL — traMADol HCL 50 MG TABS: 50 | 3 days supply | Qty: 10 | Fill #0

## 2017-04-29 ENCOUNTER — Telehealth: Payer: Self-pay

## 2017-04-29 NOTE — Telephone Encounter (Signed)
CMA call regarding appt on 04/30/17 @ 11 am @ [facility]   Patient did not answer but left a vm stating the reason of the call 7 to call back if have any questions

## 2017-04-30 ENCOUNTER — Ambulatory Visit (HOSPITAL_COMMUNITY)
Admission: RE | Admit: 2017-04-30 | Discharge: 2017-04-30 | Disposition: A | Discharge status: Home / Self Care | Payer: Self-pay | Source: Ambulatory Visit | Attending: Family Medicine | Admitting: Family Medicine

## 2017-04-30 DIAGNOSIS — M7989 Other specified soft tissue disorders: Secondary | ICD-10-CM

## 2017-04-30 NOTE — Progress Notes (Signed)
*  PRELIMINARY RESULTS* Vascular Ultrasound Left lower extremity venous duplex has been completed.  Preliminary findings: No evidence of deep vein thrombosis or baker's cyst in the left lower extremity.  Preliminary results called to Dr. Cindee SaltHariston's office, given to [facility] @ 11:21.   Chauncey FischerCharlotte C Suhaila Troiano 04/30/2017, 11:20 AM

## 2017-05-07 ENCOUNTER — Ambulatory Visit: Payer: Self-pay | Attending: Family Medicine | Admitting: *Deleted

## 2017-05-07 ENCOUNTER — Telehealth: Payer: Self-pay

## 2017-05-07 VITALS — BP 142/90 | HR 91 | Wt 196.0 lb

## 2017-05-07 DIAGNOSIS — Z79899 Other long term (current) drug therapy: Secondary | ICD-10-CM | POA: Insufficient documentation

## 2017-05-07 DIAGNOSIS — Z0130 Encounter for examination of blood pressure without abnormal findings: Secondary | ICD-10-CM

## 2017-05-07 DIAGNOSIS — I10 Essential (primary) hypertension: Secondary | ICD-10-CM | POA: Insufficient documentation

## 2017-05-07 NOTE — Progress Notes (Signed)
[facility] Pt alert and oriented and arrives in good spirits. Last OV 04/22/2017 with M.Hairston, FNP.   Pt denies chest pain, SOB, HA, dizziness, or blurred vision today.   Verified medication. Pt states she is not taking any medications.  She states it makes her feel very bad. Pt states she has made changes to diet and walks daily.    Manual blood pressure reading: 140/90 and 142/90  Pt informed of target BP goal. Verbalized understanding.  Will route nurse visit to PCP

## 2017-05-07 NOTE — Telephone Encounter (Signed)
-----   Message from Lizbeth BarkMandesia R Hairston, FNP sent at 05/07/2017  1:51 PM EDT ----- Imaging of left lower leg shows no clot present.

## 2017-05-07 NOTE — Telephone Encounter (Signed)
CMA call regarding lab results   Patient did not answer but left a detailed message per DPR  

## 2017-05-13 ENCOUNTER — Other Ambulatory Visit: Payer: Self-pay | Admitting: Obstetrics and Gynecology

## 2017-05-13 DIAGNOSIS — R928 Other abnormal and inconclusive findings on diagnostic imaging of breast: Secondary | ICD-10-CM

## 2017-05-28 ENCOUNTER — Ambulatory Visit
Admission: RE | Admit: 2017-05-28 | Discharge: 2017-05-28 | Disposition: A | Discharge status: Home / Self Care | Payer: Medicaid Other | Source: Ambulatory Visit | Attending: Obstetrics and Gynecology | Admitting: Obstetrics and Gynecology

## 2017-05-28 ENCOUNTER — Encounter (HOSPITAL_COMMUNITY): Payer: Self-pay

## 2017-05-28 ENCOUNTER — Ambulatory Visit (HOSPITAL_COMMUNITY)
Admission: RE | Admit: 2017-05-28 | Discharge: 2017-05-28 | Disposition: A | Discharge status: Home / Self Care | Payer: Self-pay | Source: Ambulatory Visit | Attending: Obstetrics and Gynecology | Admitting: Obstetrics and Gynecology

## 2017-05-28 VITALS — BP 155/102 | HR 69 | Temp 98.0°F | Ht 61.0 in | Wt 195.5 lb

## 2017-05-28 DIAGNOSIS — Z1239 Encounter for other screening for malignant neoplasm of breast: Secondary | ICD-10-CM

## 2017-05-28 DIAGNOSIS — R928 Other abnormal and inconclusive findings on diagnostic imaging of breast: Secondary | ICD-10-CM

## 2017-05-28 NOTE — Patient Instructions (Signed)
Explained breast self awareness with Carla Yu. Patient did not need a Pap smear today due to last Pap smear and HPV typing was 12/14/2016. Let her know [facility] will cover Pap smears and HPV typing every 5 years unless has a history of abnormal Pap smears. [facility]. Appointment scheduled for Tuesday, May 28, 2017 at 1440. Let patient know the Breast Center will follow up with her within the next couple weeks with results of mammogram by letter or phone. Carla ConesDorothy Stagner verbalized understanding.  Terresa Marlett, Kathaleen Maserhristine Poll, RN 2:32 PM

## 2017-05-28 NOTE — Progress Notes (Signed)
Patient complained of right nipple being dry at times that looks like dried breast milk. Patient stated has noticed this time to time for over 15 years.   Pap Smear: Pap smear not completed today. [facility] and normal with negative HPV. Per patient has a history of an abnormal Pap smear in 1985 that a colposcopy and cryotherapy were completed for follow up. Patient states all Pap smears have been normal since cryotherapy. Last Pap smear result is in EPIC.  Physical exam: Breasts Breasts symmetrical. No skin abnormalities bilateral breasts. No nipple retraction bilateral breasts. No nipple discharge bilateral breasts. No lymphadenopathy. No lumps palpated bilateral breasts. No complaints of pain or tenderness on exam. [facility] mamm[facility]. Appointment scheduled for Tuesday, May 28, 2017 at 1440.       Pelvic/Bimanual No Pap smear completed today since last Pap smear and HPV typing was 12/14/2016. Pap smear not indicated per [facility] guidelines.   Smoking History: Patient is a former smoker. Patient stated she quit 15 years ago.  Patient Navigation: Patient education provided. Access to services provided for patient through [facility] program.   Colorectal Cancer Screening: Per patient has never had a colonoscopy completed. No complaints today. FIT Test given to patient to complete and return to [facility].

## 2017-06-03 ENCOUNTER — Other Ambulatory Visit: Payer: Self-pay

## 2017-06-06 ENCOUNTER — Encounter (HOSPITAL_COMMUNITY): Payer: Self-pay

## 2017-06-06 LAB — PLEASE NOTE

## 2017-06-06 LAB — FECAL OCCULT BLOOD, IMMUNOCHEMICAL: Fecal Occult Bld: NEGATIVE

## 2017-06-21 ENCOUNTER — Ambulatory Visit: Payer: No Typology Code available for payment source

## 2017-06-21 ENCOUNTER — Ambulatory Visit: Payer: Self-pay

## 2017-08-22 ENCOUNTER — Ambulatory Visit: Payer: No Typology Code available for payment source

## 2017-08-26 ENCOUNTER — Ambulatory Visit: Payer: No Typology Code available for payment source | Admitting: Family Medicine

## 2017-09-12 ENCOUNTER — Ambulatory Visit: Payer: Self-pay | Attending: Family Medicine | Admitting: Family Medicine

## 2017-09-12 ENCOUNTER — Encounter: Payer: Self-pay | Admitting: Family Medicine

## 2017-09-12 VITALS — BP 128/86 | HR 91 | Temp 98.5°F | Resp 16 | Ht 61.0 in | Wt 203.2 lb

## 2017-09-12 DIAGNOSIS — Z6838 Body mass index (BMI) 38.0-38.9, adult: Secondary | ICD-10-CM | POA: Insufficient documentation

## 2017-09-12 DIAGNOSIS — Z79899 Other long term (current) drug therapy: Secondary | ICD-10-CM | POA: Insufficient documentation

## 2017-09-12 DIAGNOSIS — B351 Tinea unguium: Secondary | ICD-10-CM | POA: Insufficient documentation

## 2017-09-12 DIAGNOSIS — E669 Obesity, unspecified: Secondary | ICD-10-CM | POA: Insufficient documentation

## 2017-09-12 DIAGNOSIS — L720 Epidermal cyst: Secondary | ICD-10-CM | POA: Insufficient documentation

## 2017-09-12 DIAGNOSIS — B070 Plantar wart: Secondary | ICD-10-CM | POA: Insufficient documentation

## 2017-09-12 DIAGNOSIS — I10 Essential (primary) hypertension: Secondary | ICD-10-CM | POA: Insufficient documentation

## 2017-09-12 DIAGNOSIS — L03032 Cellulitis of left toe: Secondary | ICD-10-CM | POA: Insufficient documentation

## 2017-09-12 MED ORDER — CICLOPIROX 8 % EX SOLN: Freq: Every day | CUTANEOUS | 0 refills | Status: DC

## 2017-09-12 MED FILL — CICLOPIROX 8% SOLUTION: 8 | 30 days supply | Qty: 7 | Fill #0

## 2017-09-12 NOTE — Patient Instructions (Addendum)
Managing Your Hypertension Hypertension is commonly called high blood pressure. This is when the force of your blood pressing against the walls of your arteries is too strong. Arteries are blood vessels that carry blood from your heart throughout your body. Hypertension forces the heart to work harder to pump blood, and may cause the arteries to become narrow or stiff. Having untreated or uncontrolled hypertension can cause heart attack, stroke, kidney disease, and other problems. What are blood pressure readings? A blood pressure reading consists of a higher number over a lower number. Ideally, your blood pressure should be below 120/80. The first ("top") number is called the systolic pressure. It is a measure of the pressure in your arteries as your heart beats. The second ("bottom") number is called the diastolic pressure. It is a measure of the pressure in your arteries as the heart relaxes. What does my blood pressure reading mean? Blood pressure is classified into four stages. Based on your blood pressure reading, your health care provider may use the following stages to determine what type of treatment you need, if any. Systolic pressure and diastolic pressure are measured in a unit called mm Hg. Normal  Systolic pressure: below 120.  Diastolic pressure: below 80. Elevated  Systolic pressure: 120-129.  Diastolic pressure: below 80. Hypertension stage 1  Systolic pressure: 130-139.  Diastolic pressure: 80-89. Hypertension stage 2  Systolic pressure: 140 or above.  Diastolic pressure: 90 or above. What health risks are associated with hypertension? Managing your hypertension is an important responsibility. Uncontrolled hypertension can lead to:  A heart attack.  A stroke.  A weakened blood vessel (aneurysm).  Heart failure.  Kidney damage.  Eye damage.  Metabolic syndrome.  Memory and concentration problems.  What changes can I make to manage my  hypertension? Hypertension can be managed by making lifestyle changes and possibly by taking medicines. Your health care provider will help you make a plan to bring your blood pressure within a normal range. Eating and drinking  Eat a diet that is high in fiber and potassium, and low in salt (sodium), added sugar, and fat. An example eating plan is called the DASH (Dietary Approaches to Stop Hypertension) diet. To eat this way: ? Eat plenty of fresh fruits and vegetables. Try to fill half of your plate at each meal with fruits and vegetables. ? Eat whole grains, such as whole wheat pasta, brown rice, or whole grain bread. Fill about one quarter of your plate with whole grains. ? Eat low-fat diary products. ? Avoid fatty cuts of meat, processed or cured meats, and poultry with skin. Fill about one quarter of your plate with lean proteins such as fish, chicken without skin, beans, eggs, and tofu. ? Avoid premade and processed foods. These tend to be higher in sodium, added sugar, and fat.  Reduce your daily sodium intake. Most people with hypertension should eat less than 1,500 mg of sodium a day.  Limit alcohol intake to no more than 1 drink a day for nonpregnant women and 2 drinks a day for men. One drink equals 12 oz of beer, 5 oz of wine, or 1 oz of hard liquor. Lifestyle  Work with your health care provider to maintain a healthy body weight, or to lose weight. Ask what an ideal weight is for you.  Get at least 30 minutes of exercise that causes your heart to beat faster (aerobic exercise) most days of the week. Activities may include walking, swimming, or biking.  Include exercise   to strengthen your muscles (resistance exercise), such as weight lifting, as part of your weekly exercise routine. Try to do these types of exercises for 30 minutes at least 3 days a week.  Do not use any products that contain nicotine or tobacco, such as cigarettes and e-cigarettes. If you need help quitting, ask  your health care provider.  Control any long-term (chronic) conditions you have, such as high cholesterol or diabetes. Monitoring  Monitor your blood pressure at home as told by your health care provider. Your personal target blood pressure may vary depending on your medical conditions, your age, and other factors.  Have your blood pressure checked regularly, as often as told by your health care provider. Working with your health care provider  Review all the medicines you take with your health care provider because there may be side effects or interactions.  Talk with your health care provider about your diet, exercise habits, and other lifestyle factors that may be contributing to hypertension.  Visit your health care provider regularly. Your health care provider can help you create and adjust your plan for managing hypertension. Will I need medicine to control my blood pressure? Your health care provider may prescribe medicine if lifestyle changes are not enough to get your blood pressure under control, and if:  Your systolic blood pressure is 130 or higher.  Your diastolic blood pressure is 80 or higher.  Take medicines only as told by your health care provider. Follow the directions carefully. Blood pressure medicines must be taken as prescribed. The medicine does not work as well when you skip doses. Skipping doses also puts you at risk for problems. Contact a health care provider if:  You think you are having a reaction to medicines you have taken.  You have repeated (recurrent) headaches.  You feel dizzy.  You have swelling in your ankles.  You have trouble with your vision. Get help right away if:  You develop a severe headache or confusion.  You have unusual weakness or numbness, or you feel faint.  You have severe pain in your chest or abdomen.  You vomit repeatedly.  You have trouble breathing. Summary  Hypertension is when the force of blood pumping through  your arteries is too strong. If this condition is not controlled, it may put you at risk for serious complications.  Your personal target blood pressure may vary depending on your medical conditions, your age, and other factors. For most people, a normal blood pressure is less than 120/80.  Hypertension is managed by lifestyle changes, medicines, or both. Lifestyle changes include weight loss, eating a healthy, low-sodium diet, exercising more, and limiting alcohol. This information is not intended to replace advice given to you by your health care provider. Make sure you discuss any questions you have with your health care provider. Document Released: 06/25/2012 Document Revised: 08/29/2016 Document Reviewed: 08/29/2016 Elsevier Interactive Patient Education  2018 [employer]     Plantar Warts Warts are small growths on the skin. They can occur on various areas of the body. When they occur on the underside (sole) of the foot, they are called plantar warts. Plantar warts often occur in groups, with several small warts around a larger growth. They tend to develop over areas of pressure, such as the heel or the ball of the foot. Most warts are not painful, and they usually do not cause problems. However, plantar warts may cause pain when you walk because pressure is applied to them. Warts often  go away on their own in time. Various treatments may be done if needed. Sometimes, warts go away and then they come back again. What are the causes? Plantar warts are caused by a type of virus that is called human papillomavirus (HPV). HPV attacks a break in the skin of the foot. Walking barefoot can lead to exposure to the virus. These warts may spread to other areas of the sole. They spread to other areas of the body only through direct contact. What increases the risk? Plantar warts are more likely to develop in:  People who are 29-59 years of age.  People who use public showers or locker  rooms.  People who have a weakened body defense system (immune system).  What are the signs or symptoms? Plantar warts may be flat or slightly raised. They may grow into the deeper layers of skin or rise above the surface of the skin. Most plantar warts have a rough surface. They may cause pain when you use your foot to support your body weight. How is this diagnosed? A plantar wart can usually be diagnosed from its appearance. In some cases, a tissue sample may be removed (biopsy) to be looked at under a microscope. How is this treated? In many cases, warts do not need treatment. Without treatment, they often go away over a period of many months to a couple years. If treatment is needed, options may include:  Applying medicated solutions, creams, or patches to the wart. These may be over-the-counter or prescription medicines that make the skin soft so that layers will gradually shed away. In many cases, the medicine is applied one or two times per day and covered with a bandage.  Putting duct tape over the top of the wart (occlusion). You will leave the tape in place for as long as told by your health care provider, then you will replace it with a new strip of tape. This is done until the wart goes away.  Freezing the wart with liquid nitrogen (cryotherapy).  Burning the wart with: ? Laser treatment. ? An electrified probe (electrocautery).  Injection of a medicine (Candida antigen) into the wart to help the body's immune system to fight off the wart.  Surgery to remove the wart.  Follow these instructions at home:  Apply medicated creams or solutions only as told by your health care provider. This may involve: ? Soaking the affected area in warm water. ? Removing the top layer of softened skin before you apply the medicine. A pumice stone works well for removing the tissue. ? Applying a bandage over the affected area after you apply the medicine. ? Repeating the process daily or as  told by your health care provider.  Do not scratch or pick at a wart.  Wash your hands after you touch a wart.  If a wart is painful, try applying a bandage with a hole in the middle over the wart. The helps to take pressure off the wart.  Keep all follow-up visits as told by your health care provider. This is important. How is this prevented? Take these actions to help prevent warts:  Wear shoes and socks. Change your socks daily.  Keep your feet clean and dry.  Check your feet regularly.  Avoid direct contact with warts on other people.  Contact a health care provider if:  Your warts do not improve after treatment.  You have redness, swelling, or pain at the site of a wart.  You have bleeding from  a wart that does not stop with light pressure.  You have diabetes and you develop a wart. This information is not intended to replace advice given to you by your health care provider. Make sure you discuss any questions you have with your health care provider. Document Released: 12/22/2003 Document Revised: 03/08/2016 Document Reviewed: 12/27/2014 Elsevier Interactive Patient Education  [employer]2018 [employer]

## 2017-09-12 NOTE — Progress Notes (Signed)
Subjective:  Patient ID: Carla Yu, female    DOB: Jun 16, 1961  Age: 56 y.o. MRN: [id number]  CC: Establish Care   HPI Carla ConesDorothy Vierling presents for hypertension f/u. She is adherent to low salt diet.  She does not check BP at home. Cardiac symptoms none.Patient denies chest pain, chest pressure/discomfort, claudication, dyspnea, near-syncope, palpitations and syncope.  Cardiovascular risk factors: hypertension, obesity (BMI >= 30 kg/m2) and sedentary lifestyle. Use of agents associated with hypertension: none. History of target organ damage: none. She reports nonadherance with antihypertensive medications for three months and reports making dietary changes instead.  Toenail complaint: Onset 1 month ago.  She reports thickened discolored toenails to bilateral feet, with bleeding under the nail bed to the great toe.  She denies taking anything for symptoms.  Skin complaint: Onset greater than one year ago.  Skin nodule located to posterior neck.  She denies any redness, tenderness, or drainage.  She reports increase in size since onset.  Symptom is bothersome.   Outpatient Medications Prior to Visit  Medication Sig Dispense Refill  . acetaminophen (TYLENOL) 500 MG tablet Take 2 tablets (1,000 mg total) by mouth every 6 (six) hours as needed for moderate pain. (Patient not taking: Reported on 05/07/2017) 30 tablet 0  . amitriptyline (ELAVIL) 25 MG tablet Take 1 tablet (25 mg total) by mouth at bedtime. (Patient not taking: Reported on 05/07/2017) 30 tablet 1  . amLODipine (NORVASC) 10 MG tablet Take 1 tablet (10 mg total) by mouth daily. (Patient not taking: Reported on 05/07/2017) 90 tablet 0  . Calcium 600-400 MG-UNIT CHEW Chew 1 tablet by mouth daily. (Patient not taking: Reported on 05/07/2017) 60 tablet 2  . ciprofloxacin (CIPRO) 250 MG tablet Take 1 tablet (250 mg total) by mouth 2 (two) times daily. (Patient not taking: Reported on 05/07/2017) 14 tablet 0  . hydrochlorothiazide  (HYDRODIURIL) 25 MG tablet Take 1 tablet (25 mg total) by mouth daily. (Patient not taking: Reported on 05/07/2017) 90 tablet 0  . terbinafine (LAMISIL AT) 1 % cream Apply 1 application topically 2 (two) times daily. (Patient not taking: Reported on 05/07/2017) 30 g 0  . terbinafine (LAMISIL) 250 MG tablet Take 1 tablet (250 mg total) by mouth daily. For 14 days. (Patient not taking: Reported on 05/07/2017) 14 tablet 0  . traMADol (ULTRAM) 50 MG tablet Take 1 tablet (50 mg total) by mouth every 8 (eight) hours as needed for severe pain. (Patient not taking: Reported on 09/12/2017) 10 tablet 0   No facility-administered medications prior to visit.    Review of Systems  Constitutional: Negative.   Respiratory: Negative.   Cardiovascular: Negative.   Skin:       Neck lesion Toenail: Discoloration, bleeding.  Psychiatric/Behavioral: Negative for suicidal ideas.    Objective:  BP 128/86 (BP Location: Left Arm, Cuff Size: Normal)   Pulse 91   Temp 98.5 F (36.9 C) (Oral)   Resp 16   Ht 5\' 1"  (1.549 m)   Wt 203 lb 3.2 oz (92.2 kg)   SpO2 98%   BMI 38.39 kg/m   BP/Weight 09/12/2017 05/28/2017 05/07/2017  Systolic BP 128 155 142  Diastolic BP 86 102 90  Wt. (Lbs) 203.2 195.5 196  BMI 38.39 36.94 37.03   Physical Exam  Constitutional: She is well-developed, well-nourished, and in no distress.  Neck: No JVD present.  Small subdermal nodule to posterior, lower neck. (-) erythema drainage,   Cardiovascular: Normal rate, regular rhythm, normal heart sounds and intact distal  pulses.  Pulmonary/Chest: Effort normal and breath sounds normal.  Abdominal: There is no tenderness.  Musculoskeletal: She exhibits tenderness (mild, left great toe).  Skin: Skin is warm and dry. No erythema.  Thick and brown discolored toenails to bilateral feet. Left great toe: Thickened brown discolored toenail with lifting from the nail bed present w/ scant bloody drainage.   Nursing note and vitals  reviewed.  Assessment & Plan:   1. Plantar wart, left foot  - Ambulatory referral to Podiatry  2. Onychomycosis  - ciclopirox (PENLAC) 8 % solution; Apply topically at bedtime. Apply over nail and surrounding skin. Apply daily over previous coat. After seven (7) days, may remove with alcohol and continue cycle.  Dispense: 6.6 mL; Refill: 0  3. Cellulitis of great toe of left foot -Will order keflex po. - Ambulatory referral to Podiatry  4. Essential hypertension Recommend follow-up BP check with clinic nurse in 2 weeks to reevaluate BP. If BP is elevated recommend patient resume amlodipine at 5 mg daily and recheck in 2 weeks with nurse. Follow up with PCP in 3 months.  5. Epidermal cyst of neck  - Ambulatory referral to Dermatology   Follow-up: Return in about 2 weeks (around 05/06/2017) for BP check with clinic RN.    Lizbeth BarkMandesia R Hairston FNP

## 2017-09-12 NOTE — Progress Notes (Signed)
Patient stated that her left  big toes is bleeding and swollen. Patient stated that her hands is better now. Patient stated that she smashed her fingers during work and its healing.

## 2017-09-20 ENCOUNTER — Other Ambulatory Visit: Payer: Self-pay | Admitting: Family Medicine

## 2017-09-20 DIAGNOSIS — L03032 Cellulitis of left toe: Secondary | ICD-10-CM

## 2017-09-20 MED ORDER — CEPHALEXIN 500 MG PO CAPS: 500.0000 mg | ORAL_CAPSULE | Freq: Four times a day (QID) | ORAL | 0 refills | Status: DC

## 2017-12-10 ENCOUNTER — Telehealth: Payer: Self-pay | Admitting: Family Medicine

## 2017-12-10 NOTE — Telephone Encounter (Signed)
Patient stopped by to check on dermatology appt. Please fu.

## 2017-12-11 NOTE — Telephone Encounter (Signed)
Gm. Patient don't have insurance [facility] and I lvm with the details thank you

## 2018-01-02 ENCOUNTER — Ambulatory Visit: Payer: Medicaid Other | Admitting: Podiatry

## 2018-02-13 ENCOUNTER — Ambulatory Visit: Payer: Self-pay | Attending: Family Medicine | Admitting: Physician Assistant

## 2018-02-13 VITALS — BP 161/116 | HR 83 | Temp 98.8°F | Resp 16 | Ht 61.0 in | Wt 198.6 lb

## 2018-02-13 DIAGNOSIS — L02212 Cutaneous abscess of back [any part, except buttock]: Secondary | ICD-10-CM | POA: Insufficient documentation

## 2018-02-13 DIAGNOSIS — I10 Essential (primary) hypertension: Secondary | ICD-10-CM | POA: Insufficient documentation

## 2018-02-13 DIAGNOSIS — Z79899 Other long term (current) drug therapy: Secondary | ICD-10-CM | POA: Insufficient documentation

## 2018-02-13 DIAGNOSIS — L0291 Cutaneous abscess, unspecified: Secondary | ICD-10-CM

## 2018-02-13 MED ORDER — HYDROCHLOROTHIAZIDE 25 MG PO TABS: 25.0000 mg | ORAL_TABLET | Freq: Every day | ORAL | 0 refills | Status: DC

## 2018-02-13 MED ORDER — DOXYCYCLINE HYCLATE 100 MG PO TABS: 100.0000 mg | ORAL_TABLET | Freq: Two times a day (BID) | ORAL | 0 refills | Status: DC

## 2018-02-13 MED ORDER — AMLODIPINE BESYLATE 10 MG PO TABS: 10.0000 mg | ORAL_TABLET | Freq: Every day | ORAL | 0 refills | Status: DC

## 2018-02-13 MED FILL — HYDROCHLOROTHIAZIDE 25 MG T: 25 | 30 days supply | Qty: 30 | Fill #0

## 2018-02-13 MED FILL — AMLODIPINE BESYLATE 10 MG T: 10 | 30 days supply | Qty: 30 | Fill #0

## 2018-02-13 MED FILL — DOXYCYCLINE 100 MG TABLET: 100 | 10 days supply | Qty: 20 | Fill #0

## 2018-02-13 NOTE — Patient Instructions (Signed)
Check blood pressure 3 times per week and record and bring to your next visit.     Skin Abscess A skin abscess is an infected area on or under your skin that contains pus and other material. An abscess can happen almost anywhere on your body. Some abscesses break open (rupture) on their own. Most continue to get worse unless they are treated. The infection can spread deeper into the body and into your blood, which can make you feel sick. Treatment usually involves draining the abscess. Follow these instructions at home: Abscess Care  If you have an abscess that has not drained, place a warm, clean, wet washcloth over the abscess several times a day. Do this as told by your doctor.  Follow instructions from your doctor about how to take care of your abscess. Make sure you: ? Cover the abscess with a bandage (dressing). ? Change your bandage or gauze as told by your doctor. ? Wash your hands with soap and water before you change the bandage or gauze. If you cannot use soap and water, use hand sanitizer.  Check your abscess every day for signs that the infection is getting worse. Check for: ? More redness, swelling, or pain. ? More fluid or blood. ? Warmth. ? More pus or a bad smell. Medicines   Take over-the-counter and prescription medicines only as told by your doctor.  If you were prescribed an antibiotic medicine, take it as told by your doctor. Do not stop taking the antibiotic even if you start to feel better. General instructions  To avoid spreading the infection: ? Do not share personal care items, towels, or hot tubs with others. ? Avoid making skin-to-skin contact with other people.  Keep all follow-up visits as told by your doctor. This is important. Contact a doctor if:  You have more redness, swelling, or pain around your abscess.  You have more fluid or blood coming from your abscess.  Your abscess feels warm when you touch it.  You have more pus or a bad smell  coming from your abscess.  You have a fever.  Your muscles ache.  You have chills.  You feel sick. Get help right away if:  You have very bad (severe) pain.  You see red streaks on your skin spreading away from the abscess. This information is not intended to replace advice given to you by your health care provider. Make sure you discuss any questions you have with your health care provider. Document Released: 03/19/2008 Document Revised: 05/27/2016 Document Reviewed: 08/10/2015 Elsevier Interactive Patient Education  [employer].

## 2018-02-13 NOTE — Progress Notes (Signed)
Pt. Is here for a spot on her back and it got inflamed.  Pt. Also need a referral for dermatologist.

## 2018-02-13 NOTE — Progress Notes (Signed)
Patient ID: Carla Yu, female   DOB: [birth year], 57 y.o.   MRN: [id number]   Carla Yu, is a 57 y.o. female  WUJ:[id number]  NFA:[id number]  DOB - 02-16-1961  Subjective:  Chief Complaint and HPI: Carla Yu is a 57 y.o. female here today with inflamed cyst on her L upper back.  It has become infected in the past.   Her BP is high today bc she hasnt taken her meds and she got very upset with her roommate just before coming into the office.  She denies CP/HA/Dizziness.    ROS:   Constitutional:  No f/c, No night sweats, No unexplained weight loss. EENT:  No vision changes, No blurry vision, No hearing changes. No mouth, throat, or ear problems.  Respiratory: No cough, No SOB Cardiac: No CP, no palpitations GI:  No abd pain, No N/V/D. GU: No Urinary s/sx Musculoskeletal: No joint pain Neuro: No headache, no dizziness, no motor weakness.  Skin: No rash; + boil Endocrine:  No polydipsia. No polyuria.  Psych: Denies SI/HI  No problems updated.  ALLERGIES: Allergies  Allergen Reactions  . Ibuprofen Nausea Only and Palpitations    PAST MEDICAL HISTORY: Past Medical History:  Diagnosis Date  . Anemia     MEDICATIONS AT HOME: Prior to Admission medications   Medication Sig Start Date End Date Taking? Authorizing Provider  acetaminophen (TYLENOL) 500 MG tablet Take 2 tablets (1,000 mg total) by mouth every 6 (six) hours as needed for moderate pain. Patient not taking: Reported on 05/07/2017 04/22/17   Lizbeth Bark, FNP  amitriptyline (ELAVIL) 25 MG tablet Take 1 tablet (25 mg total) by mouth at bedtime. Patient not taking: Reported on 05/07/2017 04/22/17   Lizbeth Bark, FNP  amLODipine (NORVASC) 10 MG tablet Take 1 tablet (10 mg total) by mouth daily. 02/13/18   Anders Simmonds, PA-C  Calcium 600-400 MG-UNIT CHEW Chew 1 tablet by mouth daily. Patient not taking: Reported on 05/07/2017 12/14/16   Lizbeth Bark, FNP  cephALEXin (KEFLEX) 500  MG capsule Take 1 capsule (500 mg total) by mouth 4 (four) times daily. Patient not taking: Reported on 02/13/2018 09/20/17   Lizbeth Bark, FNP  ciclopirox (PENLAC) 8 % solution Apply topically at bedtime. Apply over nail and surrounding skin. Apply daily over previous coat. After seven (7) days, may remove with alcohol and continue cycle. Patient not taking: Reported on 02/13/2018 09/12/17   Lizbeth Bark, FNP  doxycycline (VIBRA-TABS) 100 MG tablet Take 1 tablet (100 mg total) by mouth 2 (two) times daily. 02/13/18   Anders Simmonds, PA-C  hydrochlorothiazide (HYDRODIURIL) 25 MG tablet Take 1 tablet (25 mg total) by mouth daily. 02/13/18   Anders Simmonds, PA-C  terbinafine (LAMISIL AT) 1 % cream Apply 1 application topically 2 (two) times daily. Patient not taking: Reported on 05/07/2017 04/22/17   Lizbeth Bark, FNP  terbinafine (LAMISIL) 250 MG tablet Take 1 tablet (250 mg total) by mouth daily. For 14 days. Patient not taking: Reported on 05/07/2017 04/22/17   Lizbeth Bark, FNP  traMADol (ULTRAM) 50 MG tablet Take 1 tablet (50 mg total) by mouth every 8 (eight) hours as needed for severe pain. Patient not taking: Reported on 09/12/2017 04/22/17   Lizbeth Bark, FNP     Objective:  EXAM:   Vitals:   02/13/18 1021 02/13/18 1032  BP: (!) 151/117 (!) 161/116  Pulse: 83   Resp: 16   Temp: 98.8 F (37.1 C)  TempSrc: Oral   SpO2: 95%   Weight: 198 lb 9.6 oz (90.1 kg)   Height:  (1.549 m)     General appearance : A&OX3. NAD. Non-toxic-appearing HEENT: Atraumatic and Normocephalic.  PERRLA. EOM intact.  Poor dentition; multiple missing teeth Neck: supple, no JVD. No cervical lymphadenopathy. No thyromegaly Chest/Lungs:  Breathing-non-labored, Good air entry bilaterally, breath sounds normal without rales, rhonchi, or wheezing  CVS: S1 S2 regular, no murmurs, gallops, rubs  Extremities: Bilateral Lower Ext shows no edema, both legs are warm to touch with  = pulse throughout Neurology:  CN II-XII grossly intact, Non focal.   Psych:  TP linear. J/I WNL. Normal speech. Appropriate eye contact and affect.  Skin:  No Rash.  There is a 2X3 cm fluctuant and tender abscess stemming from an existing sebaceous cyst(puncta present).  No induration.    Data Review Lab Results  Component Value Date   HGBA1C 5.6 11/30/2016     Assessment & Plan   1. Essential hypertension Not controlled-not taking meds-resume medications.  Discussed dangers of being off meds at length up to and including CVA, cardiac event, and death.  She declined in office Clonidine.   - hydrochlorothiazide (HYDRODIURIL) 25 MG tablet; Take 1 tablet (25 mg total) by mouth daily.  Dispense: 90 tablet; Refill: 0 - amLODipine (NORVASC) 10 MG tablet; Take 1 tablet (10 mg total) by mouth daily.  Dispense: 90 tablet; Refill: 0  2. Abscess - doxycycline (VIBRA-TABS) 100 MG tablet; Take 1 tablet (100 mg total) by mouth 2 (two) times daily.  Dispense: 20 tablet; Refill: 0 Salt water compresses-to ED or UC if worsens-may need I&D.     Patient have been counseled extensively about nutrition and exercise  Return in about 1 month (around 03/16/2018) for assign new PCP; f/up htn.  The patient was given clear instructions to go to ER or return to medical center if symptoms don't improve, worsen or new problems develop. The patient verbalized understanding. The patient was told to call to get lab results if they haven't heard anything in the next week.     Georgian Co, PA-C [facility] and [facility] [place], [place] [phone]   02/13/2018, 10:37 AM

## 2018-02-27 ENCOUNTER — Ambulatory Visit: Payer: Self-pay | Attending: Internal Medicine | Admitting: Physician Assistant

## 2018-02-27 VITALS — BP 151/102 | HR 78 | Temp 98.7°F | Resp 16 | Ht 61.0 in | Wt 195.2 lb

## 2018-02-27 DIAGNOSIS — L0291 Cutaneous abscess, unspecified: Secondary | ICD-10-CM

## 2018-02-27 DIAGNOSIS — L02212 Cutaneous abscess of back [any part, except buttock]: Secondary | ICD-10-CM | POA: Insufficient documentation

## 2018-02-27 DIAGNOSIS — I10 Essential (primary) hypertension: Secondary | ICD-10-CM | POA: Insufficient documentation

## 2018-02-27 MED ORDER — MUPIROCIN 2 % EX OINT: TOPICAL_OINTMENT | CUTANEOUS | 0 refills | Status: DC

## 2018-02-27 MED ORDER — FLUCONAZOLE 150 MG PO TABS: 150.0000 mg | ORAL_TABLET | Freq: Once | ORAL | 0 refills | Status: AC

## 2018-02-27 MED ORDER — MUPIROCIN 2 % EX OINT: 1.0000 "application " | TOPICAL_OINTMENT | Freq: Two times a day (BID) | CUTANEOUS | 0 refills | Status: DC

## 2018-02-27 MED ORDER — SULFAMETHOXAZOLE-TRIMETHOPRIM 800-160 MG PO TABS: 1.0000 | ORAL_TABLET | Freq: Two times a day (BID) | ORAL | 0 refills | Status: DC

## 2018-02-27 NOTE — Patient Instructions (Signed)
Keep taking your blood pressure meds while you are taking your antibiotics.  Check your blood pressure outside of the office 3 to 5 times weekly and record and bring to your next visit.       Skin Abscess A skin abscess is an infected area on or under your skin that contains pus and other material. An abscess can happen almost anywhere on your body. Some abscesses break open (rupture) on their own. Most continue to get worse unless they are treated. The infection can spread deeper into the body and into your blood, which can make you feel sick. Treatment usually involves draining the abscess. Follow these instructions at home: Abscess Care  If you have an abscess that has not drained, place a warm, clean, wet washcloth over the abscess several times a day. Do this as told by your doctor.  Follow instructions from your doctor about how to take care of your abscess. Make sure you: ? Cover the abscess with a bandage (dressing). ? Change your bandage or gauze as told by your doctor. ? Wash your hands with soap and water before you change the bandage or gauze. If you cannot use soap and water, use hand sanitizer.  Check your abscess every day for signs that the infection is getting worse. Check for: ? More redness, swelling, or pain. ? More fluid or blood. ? Warmth. ? More pus or a bad smell. Medicines   Take over-the-counter and prescription medicines only as told by your doctor.  If you were prescribed an antibiotic medicine, take it as told by your doctor. Do not stop taking the antibiotic even if you start to feel better. General instructions  To avoid spreading the infection: ? Do not share personal care items, towels, or hot tubs with others. ? Avoid making skin-to-skin contact with other people.  Keep all follow-up visits as told by your doctor. This is important. Contact a doctor if:  You have more redness, swelling, or pain around your abscess.  You have more fluid or blood  coming from your abscess.  Your abscess feels warm when you touch it.  You have more pus or a bad smell coming from your abscess.  You have a fever.  Your muscles ache.  You have chills.  You feel sick. Get help right away if:  You have very bad (severe) pain.  You see red streaks on your skin spreading away from the abscess. This information is not intended to replace advice given to you by your health care provider. Make sure you discuss any questions you have with your health care provider. Document Released: 03/19/2008 Document Revised: 05/27/2016 Document Reviewed: 08/10/2015 Elsevier Interactive Patient Education  [employer].

## 2018-02-27 NOTE — Progress Notes (Signed)
Pt. Is here wanting to have her back abscess check out.

## 2018-02-27 NOTE — Progress Notes (Signed)
Patient ID: Carla Yu, female   DOB: [birth year], 57 y.o.   MRN: [id number]   Carla Yu, is a 57 y.o. female  [id number]  NFA:[id number]  DOB - 14-May-1961  Subjective:  Chief Complaint and HPI: Carla Yu is a 57 y.o. female here today with the abscess on her L upper back not completely well.  The area is still fluctuant and tender.  No fever.  While she was on the antibiotics, she was scared to take her BP meds and didn't take them.  BP is up today and she hasn't taken her meds.  She denies HA/CP/palpitations.    ROS:   Constitutional:  No f/c, No night sweats, No unexplained weight loss. EENT:  No vision changes, No blurry vision, No hearing changes. No mouth, throat, or ear problems.  Respiratory: No cough, No SOB Cardiac: No CP, no palpitations GI:  No abd pain, No N/V/D. GU: No Urinary s/sx Musculoskeletal: No joint pain Neuro: No headache, no dizziness, no motor weakness.  Skin: No rash Endocrine:  No polydipsia. No polyuria.  Psych: Denies SI/HI  No problems updated.  ALLERGIES: Allergies  Allergen Reactions  . Ibuprofen Nausea Only and Palpitations    PAST MEDICAL HISTORY: Past Medical History:  Diagnosis Date  . Anemia     MEDICATIONS AT HOME: Prior to Admission medications   Medication Sig Start Date End Date Taking? Authorizing Provider  amitriptyline (ELAVIL) 25 MG tablet Take 1 tablet (25 mg total) by mouth at bedtime. 04/22/17  Yes Hairston, Oren Beckmann, FNP  amLODipine (NORVASC) 10 MG tablet Take 1 tablet (10 mg total) by mouth daily. 02/13/18  Yes Georgian Co M, PA-C  hydrochlorothiazide (HYDRODIURIL) 25 MG tablet Take 1 tablet (25 mg total) by mouth daily. 02/13/18  Yes Anders Simmonds, PA-C  acetaminophen (TYLENOL) 500 MG tablet Take 2 tablets (1,000 mg total) by mouth every 6 (six) hours as needed for moderate pain. Patient not taking: Reported on 05/07/2017 04/22/17   Lizbeth Bark, FNP  Calcium 600-400 MG-UNIT CHEW Chew  1 tablet by mouth daily. Patient not taking: Reported on 05/07/2017 12/14/16   Lizbeth Bark, FNP  cephALEXin (KEFLEX) 500 MG capsule Take 1 capsule (500 mg total) by mouth 4 (four) times daily. Patient not taking: Reported on 02/13/2018 09/20/17   Lizbeth Bark, FNP  ciclopirox (PENLAC) 8 % solution Apply topically at bedtime. Apply over nail and surrounding skin. Apply daily over previous coat. After seven (7) days, may remove with alcohol and continue cycle. Patient not taking: Reported on 02/13/2018 09/12/17   Lizbeth Bark, FNP  doxycycline (VIBRA-TABS) 100 MG tablet Take 1 tablet (100 mg total) by mouth 2 (two) times daily. 02/13/18   Anders Simmonds, PA-C  fluconazole (DIFLUCAN) 150 MG tablet Take 1 tablet (150 mg total) by mouth once for 1 dose. If needed for yeast infection after antibiotics 02/27/18 02/27/18  Anders Simmonds, PA-C  mupirocin ointment (BACTROBAN) 2 % Place 1 application into the nose 2 (two) times daily. 02/27/18   Anders Simmonds, PA-C  sulfamethoxazole-trimethoprim (BACTRIM DS,SEPTRA DS) 800-160 MG tablet Take 1 tablet by mouth 2 (two) times daily. 02/27/18   Anders Simmonds, PA-C  terbinafine (LAMISIL AT) 1 % cream Apply 1 application topically 2 (two) times daily. Patient not taking: Reported on 05/07/2017 04/22/17   Lizbeth Bark, FNP  terbinafine (LAMISIL) 250 MG tablet Take 1 tablet (250 mg total) by mouth daily. For 14 days. Patient not taking: Reported on 05/07/2017  04/22/17   Lizbeth Bark, FNP  traMADol (ULTRAM) 50 MG tablet Take 1 tablet (50 mg total) by mouth every 8 (eight) hours as needed for severe pain. Patient not taking: Reported on 09/12/2017 04/22/17   Lizbeth Bark, FNP     Objective:  EXAM:   Vitals:   02/27/18 0858  BP: (!) 151/102  Pulse: 78  Resp: 16  Temp: 98.7 F (37.1 C)  TempSrc: Oral  SpO2: 100%  Weight: 195 lb 3.2 oz (88.5 kg)  Height:  (1.549 m)    General appearance : A&OX3. NAD.  Non-toxic-appearing HEENT: Atraumatic and Normocephalic.  PERRLA. EOM intact.   Neck: supple, no JVD. No cervical lymphadenopathy. No thyromegaly Chest/Lungs:  Breathing-non-labored, Good air entry bilaterally, breath sounds normal without rales, rhonchi, or wheezing  CVS: S1 S2 regular, no murmurs, gallops, rubs  L upper back-2cm TTP fluctuant abscess without erythema or induration Extremities: Bilateral Lower Ext shows no edema, both legs are warm to touch with = pulse throughout Neurology:  CN II-XII grossly intact, Non focal.   Psych:  TP linear. J/I WNL. Normal speech. Appropriate eye contact and affect.  Skin:  No Rash  Data Review Lab Results  Component Value Date   HGBA1C 5.6 11/30/2016     Assessment & Plan   1. Abscess Improving-may need I&D-go to ED or UC if worsens - sulfamethoxazole-trimethoprim (BACTRIM DS,SEPTRA DS) 800-160 MG tablet; Take 1 tablet by mouth 2 (two) times daily.  Dispense: 20 tablet; Refill: 0 - mupirocin ointment (BACTROBAN) 2 %; Place 1 application to AA 2 (two) times daily.  Dispense: 22 g; Refill: 0 - fluconazole (DIFLUCAN) 150 MG tablet; Take 1 tablet (150 mg total) by mouth once for 1 dose. If needed for yeast infection after antibiotics  Dispense: 1 tablet; Refill: 0  2. Essential hypertension Keep taking your blood pressure meds while you are taking your antibiotics.  Check your blood pressure outside of the office 3 to 5 times weekly and record and bring to your next visit. We have discussed target BP range and blood pressure goal. I have advised patient to check BP regularly and to call us back or report to clinic if the numbers are consistently higher than 140/90. We discussed the importance of compliance with medical therapy and DASH diet recommended, consequences of uncontrolled hypertension discussed.     Patient have been counseled extensively about nutrition and exercise  Return for keep June appt with Dr Laural Benes.  The patient was  given clear instructions to go to ER or return to medical center if symptoms don't improve, worsen or new problems develop. The patient verbalized understanding. The patient was told to call to get lab results if they haven't heard anything in the next week.     Georgian Co, PA-C [facility] and [facility] [place], [place] [phone]   02/27/2018, 9:11 AM

## 2018-02-28 ENCOUNTER — Ambulatory Visit: Payer: Medicaid Other | Attending: Family Medicine

## 2018-02-28 ENCOUNTER — Telehealth: Payer: Self-pay | Admitting: Family Medicine

## 2018-02-28 NOTE — Telephone Encounter (Signed)
Patient came in today and stated that she is unable to get her meds for the abscess on her back. Patient stated that she was told that they were unable to charge her pharmacy account due to it having a $40 balanced. Patient stated she was told by previous  Visit newliif medication didn't work she  Would need to go to the ER to get it drained. Patient wanted to know if she should just go to the ER to get it drained she she is not currently taking her meds.

## 2018-03-03 NOTE — Telephone Encounter (Signed)
She needs to be on the medication. She can see if she can work out payment with the pharmacy. Thanks, Georgian Co, PA-C

## 2018-03-04 NOTE — Telephone Encounter (Signed)
CMA attempt to call patient to inform advising given by provider. No answer and left a VM for patient.

## 2018-03-11 MED FILL — FLUCONAZOLE 150 MG TABS: 150 | 1 days supply | Qty: 1 | Fill #0

## 2018-03-11 MED FILL — SULFAMETHOXAZOLE-TMP DS TAB: 800-160 | 10 days supply | Qty: 20 | Fill #0

## 2018-03-11 MED FILL — MUPIROCIN 2% OINTMENT: 2 | 11 days supply | Qty: 22 | Fill #0

## 2018-03-17 ENCOUNTER — Ambulatory Visit: Payer: Self-pay | Attending: Internal Medicine | Admitting: Internal Medicine

## 2018-03-17 ENCOUNTER — Encounter: Payer: Self-pay | Admitting: Internal Medicine

## 2018-03-17 VITALS — BP 129/90 | HR 89 | Temp 98.3°F | Resp 16 | Ht 61.0 in | Wt 196.4 lb

## 2018-03-17 DIAGNOSIS — Z8249 Family history of ischemic heart disease and other diseases of the circulatory system: Secondary | ICD-10-CM | POA: Insufficient documentation

## 2018-03-17 DIAGNOSIS — I10 Essential (primary) hypertension: Secondary | ICD-10-CM | POA: Insufficient documentation

## 2018-03-17 DIAGNOSIS — Z87891 Personal history of nicotine dependence: Secondary | ICD-10-CM | POA: Insufficient documentation

## 2018-03-17 DIAGNOSIS — Z6837 Body mass index (BMI) 37.0-37.9, adult: Secondary | ICD-10-CM | POA: Insufficient documentation

## 2018-03-17 DIAGNOSIS — L02212 Cutaneous abscess of back [any part, except buttock]: Secondary | ICD-10-CM | POA: Insufficient documentation

## 2018-03-17 DIAGNOSIS — E669 Obesity, unspecified: Secondary | ICD-10-CM | POA: Insufficient documentation

## 2018-03-17 DIAGNOSIS — Z79899 Other long term (current) drug therapy: Secondary | ICD-10-CM | POA: Insufficient documentation

## 2018-03-17 MED ORDER — ACETAMINOPHEN-CODEINE #3 300-30 MG PO TABS: 1.0000 | ORAL_TABLET | Freq: Four times a day (QID) | ORAL | 0 refills | Status: DC | PRN

## 2018-03-17 NOTE — Progress Notes (Signed)
Pt states she is still having some discomfort.   Pt states its hard to lay on her back

## 2018-03-17 NOTE — Progress Notes (Signed)
Patient ID: Carla Yu, female    DOB: 1961/05/30  MRN: [id number]  CC: re-establish and Hypertension   Subjective: Carla Yu is a 57 y.o. female who presents for chronic ds management and to est with me as PCP.  Her concerns today include:  HTN, obesity  Pt's main concern today is pain from an abscess on her upper back which she has had for 2 wks.  Seen by PA at the time and given abx but was not able to get it filled until 6 days ago due to limited finances.  The area remains painful and draining despite taking the Bactrim.  HTN: reports compliance with HCTZ and Norvasc which she tolerates well   Patient Active Problem List   Diagnosis Date Noted  . Essential hypertension 12/14/2016     Current Outpatient Medications on File Prior to Visit  Medication Sig Dispense Refill  . amitriptyline (ELAVIL) 25 MG tablet Take 1 tablet (25 mg total) by mouth at bedtime. 30 tablet 1  . amLODipine (NORVASC) 10 MG tablet Take 1 tablet (10 mg total) by mouth daily. 90 tablet 0  . Calcium 600-400 MG-UNIT CHEW Chew 1 tablet by mouth daily. (Patient not taking: Reported on 05/07/2017) 60 tablet 2  . hydrochlorothiazide (HYDRODIURIL) 25 MG tablet Take 1 tablet (25 mg total) by mouth daily. 90 tablet 0  . mupirocin ointment (BACTROBAN) 2 % Apply Bid to AA 22 g 0  . sulfamethoxazole-trimethoprim (BACTRIM DS,SEPTRA DS) 800-160 MG tablet Take 1 tablet by mouth 2 (two) times daily. (Patient not taking: Reported on 03/17/2018) 20 tablet 0  . terbinafine (LAMISIL AT) 1 % cream Apply 1 application topically 2 (two) times daily. (Patient not taking: Reported on 05/07/2017) 30 g 0  . terbinafine (LAMISIL) 250 MG tablet Take 1 tablet (250 mg total) by mouth daily. For 14 days. (Patient not taking: Reported on 05/07/2017) 14 tablet 0  . traMADol (ULTRAM) 50 MG tablet Take 1 tablet (50 mg total) by mouth every 8 (eight) hours as needed for severe pain. (Patient not taking: Reported on 09/12/2017) 10  tablet 0   No current facility-administered medications on file prior to visit.     Allergies  Allergen Reactions  . Ibuprofen Nausea Only and Palpitations    Social History   Socioeconomic History  . Marital status: Single    Spouse name: Not on file  . Number of children: Not on file  . Years of education: Not on file  . Highest education level: Not on file  Occupational History  . Not on file  Social Needs  . Financial resource strain: Not on file  . Food insecurity:    Worry: Not on file    Inability: Not on file  . Transportation needs:    Medical: Not on file    Non-medical: Not on file  Tobacco Use  . Smoking status: Former Smoker    Types: Cigarettes    Last attempt to quit: 10/15/2001    Years since quitting: 16.4  . Smokeless tobacco: Never Used  Substance and Sexual Activity  . Alcohol use: No  . Drug use: No  . Sexual activity: Not Currently    Birth control/protection: None  Lifestyle  . Physical activity:    Days per week: Not on file    Minutes per session: Not on file  . Stress: Not on file  Relationships  . Social connections:    Talks on phone: Not on file    Gets together:  Not on file    Attends religious service: Not on file    Active member of club or organization: Not on file    Attends meetings of clubs or organizations: Not on file    Relationship status: Not on file  . Intimate partner violence:    Fear of current or ex partner: Not on file    Emotionally abused: Not on file    Physically abused: Not on file    Forced sexual activity: Not on file  Other Topics Concern  . Not on file  Social History Narrative  . Not on file    Family History  Problem Relation Age of Onset  . Depression Mother   . Hypertension Mother   . Cancer Father     No past surgical history on file.  ROS: Review of Systems Neg except PHYSICAL EXAM: BP 129/90   Pulse 89   Temp 98.3 F (36.8 C) (Oral)   Resp 16   Ht 5\' 1"  (1.549 m)   Wt 196 lb  6.4 oz (89.1 kg)   SpO2 98%   BMI 37.11 kg/m   Physical Exam General appearance - alert, well appearing, and in no distress Mental status - normal mood, behavior, speech, dress, motor activity, and thought processes Skin -4 cm, round, raised area over the superior portion of LT scapular.  Very tender to touch, warm. +Fluctuance   ASSESSMENT AND PLAN: There are no diagnoses linked to this encounter. 1. Abscess of back -recommend being seen in ED to have this I&D.  Continue Bactrim Limited supply of Tyl#3 given.  NCCSRS reviewed.  2. Essential hypertension Continue current meds and salt intake restriction  Patient was given the opportunity to ask questions.  Patient verbalized understanding of the plan and was able to repeat key elements of the plan.   No orders of the defined types were placed in this encounter.    Requested Prescriptions   Signed Prescriptions Disp Refills  . acetaminophen-codeine (TYLENOL #3) 300-30 MG tablet 20 tablet 0    Sig: Take 1 tablet by mouth every 6 (six) hours as needed for moderate pain.    Return in about 3 months (around 06/17/2018).  Jonah Blue, MD, FACP

## 2018-03-28 ENCOUNTER — Telehealth: Payer: Self-pay | Admitting: Internal Medicine

## 2018-03-28 DIAGNOSIS — L720 Epidermal cyst: Secondary | ICD-10-CM

## 2018-03-28 DIAGNOSIS — B070 Plantar wart: Secondary | ICD-10-CM

## 2018-03-28 MED FILL — ACETAMINOPHEN/COD #3 TABLET: 300-30 | 5 days supply | Qty: 20 | Fill #0

## 2018-03-28 NOTE — Telephone Encounter (Signed)
Will forward to pcp

## 2018-03-28 NOTE — Telephone Encounter (Signed)
Patient called requesting to be referred to the Dermatologist b/c of the abscess that she has in her back. Also she would like a Podiatry referral b/c of the wart that she has on her left foot. Contacted the referral coordinator and she stated that new referrals needed to be put in b/c the referrals were closed. Please f/u

## 2018-04-11 ENCOUNTER — Telehealth: Payer: Self-pay | Admitting: Internal Medicine

## 2018-04-11 NOTE — Telephone Encounter (Signed)
Patient stopped by the office and asked if the referrals for podiatry and dermatologyy be reopened. The patient now has a phone that works and patients chart is up to date. Please fu at your earliest convenience.

## 2018-05-19 ENCOUNTER — Ambulatory Visit: Payer: Medicaid Other

## 2018-05-22 ENCOUNTER — Other Ambulatory Visit: Payer: Self-pay | Admitting: Internal Medicine

## 2018-05-22 DIAGNOSIS — Z1231 Encounter for screening mammogram for malignant neoplasm of breast: Secondary | ICD-10-CM

## 2018-05-23 ENCOUNTER — Telehealth (HOSPITAL_COMMUNITY): Payer: Self-pay

## 2018-05-23 NOTE — Telephone Encounter (Signed)
Tried to reach I did leave a message to call [facility] °

## 2018-06-30 ENCOUNTER — Encounter: Payer: Self-pay | Admitting: Internal Medicine

## 2018-06-30 ENCOUNTER — Ambulatory Visit: Payer: Self-pay | Attending: Internal Medicine | Admitting: Internal Medicine

## 2018-06-30 VITALS — BP 163/102 | HR 66 | Temp 98.2°F | Resp 16 | Wt 188.6 lb

## 2018-06-30 DIAGNOSIS — Z79899 Other long term (current) drug therapy: Secondary | ICD-10-CM | POA: Insufficient documentation

## 2018-06-30 DIAGNOSIS — Z87891 Personal history of nicotine dependence: Secondary | ICD-10-CM | POA: Insufficient documentation

## 2018-06-30 DIAGNOSIS — G6289 Other specified polyneuropathies: Secondary | ICD-10-CM

## 2018-06-30 DIAGNOSIS — Z886 Allergy status to analgesic agent status: Secondary | ICD-10-CM | POA: Insufficient documentation

## 2018-06-30 DIAGNOSIS — L720 Epidermal cyst: Secondary | ICD-10-CM | POA: Insufficient documentation

## 2018-06-30 DIAGNOSIS — Z1211 Encounter for screening for malignant neoplasm of colon: Secondary | ICD-10-CM | POA: Insufficient documentation

## 2018-06-30 DIAGNOSIS — Z8249 Family history of ischemic heart disease and other diseases of the circulatory system: Secondary | ICD-10-CM | POA: Insufficient documentation

## 2018-06-30 DIAGNOSIS — I10 Essential (primary) hypertension: Secondary | ICD-10-CM | POA: Insufficient documentation

## 2018-06-30 DIAGNOSIS — E669 Obesity, unspecified: Secondary | ICD-10-CM | POA: Insufficient documentation

## 2018-06-30 DIAGNOSIS — G629 Polyneuropathy, unspecified: Secondary | ICD-10-CM | POA: Insufficient documentation

## 2018-06-30 DIAGNOSIS — Z131 Encounter for screening for diabetes mellitus: Secondary | ICD-10-CM | POA: Insufficient documentation

## 2018-06-30 MED ORDER — GABAPENTIN 300 MG PO CAPS: 300.0000 mg | ORAL_CAPSULE | Freq: Every day | ORAL | 6 refills | Status: DC

## 2018-06-30 NOTE — Progress Notes (Signed)
Patient ID: Carla Yu, female    DOB: [birth year]  MRN: [id number]  CC: Hypertension   Subjective: Carla Yu is a 57 y.o. female who presents for chronic disease management. Her concerns today include:  HTN, obesity  Last seen in June with abscess on upper back.  I recommended that she be seen in ER for I&D but pt did not go.  Reports that it resolved with the antibiotics. -[facility] derm [facility].  Dx with epidermal cyst.  Because it was not inflamed at the time, he told her that it does not need to be removed at this time.  Told her to follow-up if it becomes bothersome again.  HYPERTENSION Currently taking: see medication list Med Adherence: [x]  Yes but did not take medications as yet for the morning.  Medication side effects: []  Yes    [x]  No Adherence with salt restriction: [x]  Yes, "but I cheated and ate some bacon yesterday."    []  No Home Monitoring?: [x]  Yes    []  No Monitoring Frequency: [x]  Yes, QOD    []  No Home BP results range: 120s/90s SOB? []  Yes    [x]  No Chest Pain?: [x]  Yes,occasionally    []  No Leg swelling?: [x]  Yes in the ankles    []  No Headaches?: []  Yes    [x]  No Dizziness? []  Yes    [x]  No Comments:   C/o intermittent numbness and burning in the feet (soles) for few yrs off and on.  Becoming more persistent.  Dx with neuropathy in 2010.  Dx with pre-DM.    HM:  Declines flu shot.  Had neg MMG last yr.  No hx of breast CA or bx.  No breast CA or ovarian CA in 1st degree relatives.  Due for FIT test  Patient Active Problem List   Diagnosis Date Noted  . Essential hypertension 12/14/2016     Current Outpatient Medications on File Prior to Visit  Medication Sig Dispense Refill  . acetaminophen-codeine (TYLENOL #3) 300-30 MG tablet Take 1 tablet by mouth every 6 (six) hours as needed for moderate pain. 20 tablet 0  . amLODipine (NORVASC) 10 MG tablet Take 1 tablet (10 mg total) by mouth daily. 90 tablet 0  .  hydrochlorothiazide (HYDRODIURIL) 25 MG tablet Take 1 tablet (25 mg total) by mouth daily. 90 tablet 0  . mupirocin ointment (BACTROBAN) 2 % Apply Bid to AA 22 g 0   No current facility-administered medications on file prior to visit.     Allergies  Allergen Reactions  . Ibuprofen Nausea Only and Palpitations    Social History   Socioeconomic History  . Marital status: Single    Spouse name: Not on file  . Number of children: Not on file  . Years of education: Not on file  . Highest education level: Not on file  Occupational History  . Not on file  Social Needs  . Financial resource strain: Not on file  . Food insecurity:    Worry: Not on file    Inability: Not on file  . Transportation needs:    Medical: Not on file    Non-medical: Not on file  Tobacco Use  . Smoking status: Former Smoker    Types: Cigarettes    Last attempt to quit: 10/15/2001    Years since quitting: 16.7  . Smokeless tobacco: Never Used  Substance and Sexual Activity  . Alcohol use: No  . Drug use: No  .  Sexual activity: Not Currently    Birth control/protection: None  Lifestyle  . Physical activity:    Days per week: Not on file    Minutes per session: Not on file  . Stress: Not on file  Relationships  . Social connections:    Talks on phone: Not on file    Gets together: Not on file    Attends religious service: Not on file    Active member of club or organization: Not on file    Attends meetings of clubs or organizations: Not on file    Relationship status: Not on file  . Intimate partner violence:    Fear of current or ex partner: Not on file    Emotionally abused: Not on file    Physically abused: Not on file    Forced sexual activity: Not on file  Other Topics Concern  . Not on file  Social History Narrative  . Not on file    Family History  Problem Relation Age of Onset  . Depression Mother   . Hypertension Mother   . Cancer Father     No past surgical history on  file.  ROS: Review of Systems Negative except as above. PHYSICAL EXAM: BP (!) 163/102 (BP Location: Right Arm, Cuff Size: Normal)   Pulse 66   Temp 98.2 F (36.8 C) (Oral)   Resp 16   Wt 188 lb 9.6 oz (85.5 kg)   SpO2 99%   BMI 35.64 kg/m   Physical Exam  General appearance - alert, well appearing, and in no distress Mental status - normal mood, behavior, speech, dress, motor activity, and thought processes Chest - clear to auscultation, no wheezes, rales or rhonchi, symmetric air entry Heart - normal rate, regular rhythm, normal S1, S2, no murmurs, rubs, clicks or gallops Extremities -no lower extremity edema.  Dorsalis pedis and posterior tibialis pulses 3+ bilaterally. Feet: Toenails are thick and discolored.  Dry cracking skin on the soles of both feet.  LEAP exam abnormal. Skin: Firmness of about 3 m on the upper back medial to the left scapula.  No erythema or fluctuance at this time.  ASSESSMENT AND PLAN: 1. Essential hypertension Not at goal.  Patient to take her medications as soon as she returns home.  Follow-up for blood pressure check in 2 weeks with clinical pharmacist. - CBC - Comprehensive metabolic panel - Lipid panel  2. Epidermal cyst Not bothersome at this time.  3. Other polyneuropathy Will screen for diabetes and B12/folate deficiency.  Patient is agreeable to starting gabapentin 300 mg at bedtime.  Discussed the importance of proper footwear. - gabapentin (NEURONTIN) 300 MG capsule; Take 1 capsule (300 mg total) by mouth at bedtime.  Dispense: 30 capsule; Refill: 6 - Vitamin B12 - Folate  4. Colon cancer screening - Fecal occult blood, imunochemical([facility])  5. Diabetes mellitus screening - Hemoglobin A1c   Patient was given the opportunity to ask questions.  Patient verbalized understanding of the plan and was able to repeat key elements of the plan.   Orders Placed This Encounter  Procedures  . Fecal occult blood,  imunochemical([facility]/Sun[facility])  . CBC  . Comprehensive metabolic panel  . Lipid panel  . Hemoglobin A1c  . Vitamin B12  . Folate     Requested Prescriptions   Signed Prescriptions Disp Refills  . gabapentin (NEURONTIN) 300 MG capsule 30 capsule 6    Sig: Take 1 capsule (300 mg total) by mouth at bedtime.    Return in  about 3 months (around 09/29/2018).  Jonah Blue, MD, FACP

## 2018-06-30 NOTE — Progress Notes (Signed)
Pt states she thinks she has been having neuropathy in her legs at night

## 2018-06-30 NOTE — Patient Instructions (Signed)
Please give visit with clinical pharmacist in 2 weeks for blood pressure recheck.  Your blood pressure is elevated.  The goal is 130/80 or lower.  Please try to limit salt in the foods.  Take your blood pressure medications every day as prescribed.  Start gabapentin 300 mg at bedtime for the numbness and burning that you are having in your feet.  Please complete the fecal test that will be given to you today.  Please mail it out the same day that you use it.

## 2018-07-01 LAB — COMPREHENSIVE METABOLIC PANEL
ALK PHOS: 93 IU/L (ref 39–117)
ALT: 18 IU/L (ref 0–32)
AST: 28 IU/L (ref 0–40)
Albumin/Globulin Ratio: 1.9 (ref 1.2–2.2)
Albumin: 4.4 g/dL (ref 3.5–5.5)
BILIRUBIN TOTAL: 0.4 mg/dL (ref 0.0–1.2)
BUN/Creatinine Ratio: 13 (ref 9–23)
BUN: 11 mg/dL (ref 6–24)
CO2: 24 mmol/L (ref 20–29)
Calcium: 9.7 mg/dL (ref 8.7–10.2)
Chloride: 101 mmol/L (ref 96–106)
Creatinine, Ser: 0.86 mg/dL (ref 0.57–1.00)
GFR calc non Af Amer: 74 mL/min/{1.73_m2} (ref >59)
GFR, EST AFRICAN AMERICAN: 86 mL/min/{1.73_m2} (ref >59)
GLUCOSE: 84 mg/dL (ref 65–99)
Globulin, Total: 2.3 g/dL (ref 1.5–4.5)
POTASSIUM: 3.7 mmol/L (ref 3.5–5.2)
Sodium: 142 mmol/L (ref 134–144)
TOTAL PROTEIN: 6.7 g/dL (ref 6.0–8.5)

## 2018-07-01 LAB — CBC
HEMATOCRIT: 39.1 % (ref 34.0–46.6)
HEMOGLOBIN: 12.3 g/dL (ref 11.1–15.9)
MCH: 23.7 pg — AB (ref 26.6–33.0)
MCHC: 31.5 g/dL (ref 31.5–35.7)
MCV: 75 fL — ABNORMAL LOW (ref 79–97)
Platelets: 201 10*3/uL (ref 150–450)
RBC: 5.19 x10E6/uL (ref 3.77–5.28)
RDW: 15.9 % — ABNORMAL HIGH (ref 12.3–15.4)
WBC: 5.2 10*3/uL (ref 3.4–10.8)

## 2018-07-01 LAB — LIPID PANEL
CHOL/HDL RATIO: 2.1 ratio (ref 0.0–4.4)
Cholesterol, Total: 162 mg/dL (ref 100–199)
HDL: 76 mg/dL (ref >39)
LDL CALC: 61 mg/dL (ref 0–99)
TRIGLYCERIDES: 123 mg/dL (ref 0–149)
VLDL Cholesterol Cal: 25 mg/dL (ref 5–40)

## 2018-07-01 LAB — VITAMIN B12: Vitamin B-12: 315 pg/mL (ref 232–1245)

## 2018-07-01 LAB — HEMOGLOBIN A1C
Est. average glucose Bld gHb Est-mCnc: 117 mg/dL
HEMOGLOBIN A1C: 5.7 % — AB (ref 4.8–5.6)

## 2018-07-01 LAB — FOLATE: FOLATE: 14.5 ng/mL (ref >3.0)

## 2018-07-02 ENCOUNTER — Telehealth: Payer: Self-pay

## 2018-07-02 NOTE — Telephone Encounter (Signed)
Contacted pt to go over lab results pt didn't answer lvm asking pt to give me a call at her earliest convenience   If pt calls back please give results: blood count reveals no anemia. kidney and liver function are norma Cholesterol level normal. She does have prediabetes. Healthy eating habits and regular exercise will help prevent this from becoming diabetes. Vitamin B12 level is in the low normal range. I recommend taking vitamin B12 supplement 500 mcg twice a day. This can be purchased over-the-counter.

## 2018-07-24 ENCOUNTER — Encounter: Payer: Medicaid Other | Admitting: Pharmacist

## 2018-10-02 ENCOUNTER — Ambulatory Visit: Payer: Medicaid Other | Admitting: Internal Medicine

## 2018-11-06 LAB — FECAL OCCULT BLOOD, IMMUNOCHEMICAL: FECAL OCCULT BLD: NEGATIVE

## 2018-11-06 LAB — SPECIMEN STATUS REPORT

## 2018-11-07 ENCOUNTER — Telehealth: Payer: Self-pay

## 2018-11-07 NOTE — Telephone Encounter (Signed)
Contacted pt to go over fit test pt didn't answer and was unable to lvm will try pt again another time

## 2019-02-12 NOTE — Progress Notes (Signed)
COVID Hotel Screening performed. Temperature, PHQ-9, and need for medical care and medications assessed. No additional needs assessed at this time.  Janat Tabbert RN MSN 

## 2019-02-13 ENCOUNTER — Encounter (HOSPITAL_COMMUNITY): Payer: Self-pay

## 2019-02-13 ENCOUNTER — Other Ambulatory Visit: Payer: Self-pay

## 2019-02-13 ENCOUNTER — Ambulatory Visit (HOSPITAL_COMMUNITY)
Admission: EM | Admit: 2019-02-13 | Discharge: 2019-02-13 | Disposition: A | Discharge status: Home / Self Care | Payer: Self-pay | Attending: Family Medicine | Admitting: Family Medicine

## 2019-02-13 DIAGNOSIS — R030 Elevated blood-pressure reading, without diagnosis of hypertension: Secondary | ICD-10-CM

## 2019-02-13 DIAGNOSIS — L02212 Cutaneous abscess of back [any part, except buttock]: Secondary | ICD-10-CM

## 2019-02-13 MED ORDER — SULFAMETHOXAZOLE-TRIMETHOPRIM 800-160 MG PO TABS: 1.0000 | ORAL_TABLET | Freq: Two times a day (BID) | ORAL | 0 refills | Status: AC

## 2019-02-13 MED FILL — SULFAMETHOXAZOLE-TMP DS TAB: 800-160 | 7 days supply | Qty: 14 | Fill #0

## 2019-02-13 NOTE — Discharge Instructions (Addendum)
Continue with warm compresses to abscess on back Continue to try to press on the area to remove the drainage Take antibiotic 2 times a day for a week Keep track of your blood pressure

## 2019-02-13 NOTE — ED Provider Notes (Signed)
[facility]    CSN: [id number] Arrival date & time: 02/13/19  0900     History   Chief Complaint Chief Complaint  Patient presents with  . Abscess    HPI Carla Yu is a 58 y.o. female.   HPI  Patient is here because of an abscess on her left shoulder blade.  It is recurrent.  This time is been present for about a week.  It is draining.  Is very painful.  She is here because she had difficulty working today and she was asked by her supervisor to obtain medical care. She previously took blood pressure medication.  She states she was taken off of her blood pressure pills because she did not need them anymore.  Blood pressure is elevated today.  States she has blood pressure medicine at home.  She was advised to go back on this, and to call her primary care doctor for follow-up.  She is not having any dizziness, chest pain, headache, visual disturbance.  Past Medical History:  Diagnosis Date  . Anemia     Patient Active Problem List   Diagnosis Date Noted  . Peripheral neuropathy 06/30/2018  . Epidermal cyst 06/30/2018  . Essential hypertension 12/14/2016    History reviewed. No pertinent surgical history.  OB History    Gravida  8   Para  8   Term      Preterm      AB      Living  8     SAB      TAB      Ectopic      Multiple      Live Births  8            Home Medications    Prior to Admission medications   Medication Sig Start Date End Date Taking? Authorizing Provider  acetaminophen-codeine (TYLENOL #3) 300-30 MG tablet Take 1 tablet by mouth every 6 (six) hours as needed for moderate pain. 03/17/18   Marcine MatarJohnson, Deborah B, MD  amLODipine (NORVASC) 10 MG tablet Take 1 tablet (10 mg total) by mouth daily. 02/13/18   Anders SimmondsMcClung, Angela M, PA-C  gabapentin (NEURONTIN) 300 MG capsule Take 1 capsule (300 mg total) by mouth at bedtime. 06/30/18   Marcine MatarJohnson, Deborah B, MD  hydrochlorothiazide (HYDRODIURIL) 25 MG tablet Take 1 tablet (25 mg  total) by mouth daily. 02/13/18   Anders SimmondsMcClung, Angela M, PA-C  sulfamethoxazole-trimethoprim (BACTRIM DS) 800-160 MG tablet Take 1 tablet by mouth 2 (two) times daily for 7 days. 02/13/19 02/20/19  Eustace MooreNelson, Rozelia Catapano Sue, MD    Family History Family History  Problem Relation Age of Onset  . Depression Mother   . Hypertension Mother   . Cancer Father     Social History Social History   Tobacco Use  . Smoking status: Former Smoker    Types: Cigarettes    Last attempt to quit: 10/15/2001    Years since quitting: 17.3  . Smokeless tobacco: Never Used  Substance Use Topics  . Alcohol use: No  . Drug use: No     Allergies   Ibuprofen   Review of Systems Review of Systems  Constitutional: Negative for chills and fever.  HENT: Negative for ear pain and sore throat.   Eyes: Negative for pain and visual disturbance.  Respiratory: Negative for cough and shortness of breath.   Cardiovascular: Negative for chest pain and palpitations.  Gastrointestinal: Negative for abdominal pain and vomiting.  Genitourinary: Negative for dysuria and  hematuria.  Musculoskeletal: Negative for arthralgias and back pain.  Skin: Positive for wound. Negative for color change and rash.  Neurological: Negative for seizures and syncope.  All other systems reviewed and are negative.    Physical Exam Triage Vital Signs ED Triage Vitals  Enc Vitals Group     BP 02/13/19 0915 (!) 183/110     Pulse Rate 02/13/19 0915 65     Resp 02/13/19 0915 18     Temp 02/13/19 0915 98 F (36.7 C)     Temp Source 02/13/19 0915 Oral     SpO2 02/13/19 0915 100 %     Weight --      Height --      Head Circumference --      Peak Flow --      Pain Score 02/13/19 0918 7     Pain Loc --      Pain Edu? --      Excl. in GC? --    No data found.  Updated Vital Signs BP (!) 190/106 (BP Location: Left Arm)   Pulse 65   Temp 98 F (36.7 C) (Oral)   Resp 18   SpO2 100%   Visual Acuity Right Eye Distance:   Left Eye  Distance:   Bilateral Distance:    Right Eye Near:   Left Eye Near:    Bilateral Near:     Physical Exam Constitutional:      General: She is not in acute distress.    Appearance: She is well-developed.  HENT:     Head: Normocephalic and atraumatic.  Eyes:     Conjunctiva/sclera: Conjunctivae normal.     Pupils: Pupils are equal, round, and reactive to light.  Neck:     Musculoskeletal: Normal range of motion.  Cardiovascular:     Rate and Rhythm: Normal rate and regular rhythm.     Heart sounds: Normal heart sounds.  Pulmonary:     Effort: Pulmonary effort is normal. No respiratory distress.     Breath sounds: Normal breath sounds.  Abdominal:     General: There is no distension.     Palpations: Abdomen is soft.  Musculoskeletal: Normal range of motion.       Back:  Skin:    General: Skin is warm and dry.  Neurological:     Mental Status: She is alert.  Psychiatric:        Mood and Affect: Mood normal.        Behavior: Behavior normal.      UC Treatments / Results  Labs (all labs ordered are listed, but only abnormal results are displayed) Labs Reviewed - No data to display  EKG None  Radiology No results found.  Procedures Procedures (including critical care time)  Medications Ordered in UC Medications - No data to display  Initial Impression / Assessment and Plan / UC Course  I have reviewed the triage vital signs and the nursing notes.  Pertinent labs & imaging results that were available during my care of the patient were reviewed by me and considered in my medical decision making (see chart for details).     Discussed with patient the importance of following up on her blood pressure.  Warm compresses and antibiotics of believe her abscess will resolve. Final Clinical Impressions(s) / UC Diagnoses   Final diagnoses:  Abscess of back  Elevated blood pressure reading     Discharge Instructions     Continue with warm compresses to abscess  on back Continue to try to press on the area to remove the drainage Take antibiotic 2 times a day for a week Keep track of your blood pressure   ED Prescriptions    Medication Sig Dispense Auth. Provider   sulfamethoxazole-trimethoprim (BACTRIM DS) 800-160 MG tablet Take 1 tablet by mouth 2 (two) times daily for 7 days. 14 tablet Eustace Moore, MD     Controlled Substance Prescriptions [place] Controlled Substance Registry consulted? Not Applicable   Eustace Moore, MD 02/13/19 1052

## 2019-02-13 NOTE — ED Triage Notes (Signed)
Pt presents with recurrent abscess on left shoulder/back area.

## 2019-02-18 ENCOUNTER — Ambulatory Visit: Payer: Self-pay | Attending: Internal Medicine | Admitting: Internal Medicine

## 2019-02-18 ENCOUNTER — Other Ambulatory Visit: Payer: Self-pay

## 2019-02-18 ENCOUNTER — Encounter: Payer: Self-pay | Admitting: Internal Medicine

## 2019-02-18 VITALS — BP 144/91 | HR 99 | Temp 98.4°F | Resp 16 | Wt 165.2 lb

## 2019-02-18 DIAGNOSIS — I10 Essential (primary) hypertension: Secondary | ICD-10-CM

## 2019-02-18 DIAGNOSIS — E538 Deficiency of other specified B group vitamins: Secondary | ICD-10-CM | POA: Insufficient documentation

## 2019-02-18 DIAGNOSIS — L720 Epidermal cyst: Secondary | ICD-10-CM

## 2019-02-18 DIAGNOSIS — L0291 Cutaneous abscess, unspecified: Secondary | ICD-10-CM

## 2019-02-18 DIAGNOSIS — R7303 Prediabetes: Secondary | ICD-10-CM | POA: Insufficient documentation

## 2019-02-18 MED ORDER — LISINOPRIL-HYDROCHLOROTHIAZIDE 10-12.5 MG PO TABS: 0.5000 | ORAL_TABLET | Freq: Every day | ORAL | 1 refills | Status: DC

## 2019-02-18 MED ORDER — TRAMADOL HCL 50 MG PO TABS: 50.0000 mg | ORAL_TABLET | Freq: Three times a day (TID) | ORAL | 0 refills | Status: AC | PRN

## 2019-02-18 MED ORDER — CYANOCOBALAMIN 500 MCG PO TABS: 500.0000 ug | ORAL_TABLET | Freq: Every day | ORAL | 1 refills | Status: DC

## 2019-02-18 NOTE — Progress Notes (Signed)
COVID Hotel Screening performed. Temperature, PHQ-9, and need for medical care and medications assessed. No additional needs assessed at this time.  Ahniya Mitchum RN MSN 

## 2019-02-18 NOTE — Progress Notes (Signed)
Pt states her pain is coming from her abscess

## 2019-02-18 NOTE — Progress Notes (Signed)
Patient ID: Carla Yu, female    DOB: [birth year]  MRN: [id number]  CC: ER f/u abscess LT shoulder  Subjective:  Carla Yu is a 58 y.o. female who presents for UC visit. Her concerns today include:  Pt with hx of HTN and obesity.  Last seen 06/2018   Pt seen in ER 02/13/2019 for draining boil between the shoulder blades more so on LT side.  Had seen derm last yr for same and was dx with epidermoid cyst but it was not inflamed at the time so Derm decided not to remove it. However over the past week it became inflamed red and painful.  It busted on its own expelling pus and blood.  She was seen in the emergency room.  It was not I indeed since it was draining on its own.  Patient placed on Bactrim and told to follow-up.  She is currently taking the Bactrim.  She is applying dressing changes to it.  She states that it is very sore and painful to touch.  Blood pressure noted to be elevated in the emergency room.  Patient had told him that she was taken off her blood pressure medication by her PCP which is not the case.  When we last saw her I specifically instructed her to get back on her her medications.  However patient states she has not been taking because she is trying to control her blood pressure naturally and she feels stress plays a major role.  She also requested that I go over the blood tests that done on her last visit but she never got the results.  Her B12 level was in the low normal range.  Fit test was negative.  Kidney and liver function tests were normal.  Cholesterol level was good.  A1c was in the range for prediabetes    Patient Active Problem List   Diagnosis Date Noted  . Peripheral neuropathy 06/30/2018  . Epidermal cyst 06/30/2018  . Essential hypertension 12/14/2016     Current Outpatient Medications on File Prior to Visit  Medication Sig Dispense Refill  . sulfamethoxazole-trimethoprim (BACTRIM DS) 800-160 MG tablet Take 1 tablet by mouth 2 (two)  times daily for 7 days. 14 tablet 0   No current facility-administered medications on file prior to visit.     Allergies  Allergen Reactions  . Ibuprofen Nausea Only and Palpitations     ROS: Review of Systems Negative except as above  PHYSICAL EXAM: BP (!) 144/91   Pulse 99   Temp 98.4 F (36.9 C) (Oral)   Resp 16   Wt 165 lb 3.2 oz (74.9 kg)   SpO2 98%   BMI 31.21 kg/m   Physical Exam  General appearance - alert, well appearing, and in no distress Mental status - normal mood, behavior, speech, dress, motor activity, and thought processes Neck - supple, no significant adenopathy Chest - clear to auscultation, no wheezes, rales or rhonchi, symmetric air entry Heart - normal rate, regular rhythm, normal S1, S2, no murmurs, rubs, clicks or gallops Extremities - peripheral pulses normal, no pedal edema, no clubbing or cyanosis Skin -4 x 4 centimeter raised slightly erythematous area medial to the upper border of the left scapula.  It is exquisitely tender to touch.  Pus expressed.  Patient did not tolerate me trying to drain this further  Results for orders placed or performed in visit on 06/30/18  Fecal occult blood, imunochemical([facility])  Result Value Ref Range   Fecal Occult Bld Negative  Negative  CBC  Result Value Ref Range   WBC 5.2 3.4 - 10.8 x10E3/uL   RBC 5.19 3.77 - 5.28 x10E6/uL   Hemoglobin 12.3 11.1 - 15.9 g/dL   Hematocrit 45.439.1 09.834.0 - 46.6 %   MCV 75 (L) 79 - 97 fL   MCH 23.7 (L) 26.6 - 33.0 pg   MCHC 31.5 31.5 - 35.7 g/dL   RDW 11.915.9 (H) 14.712.3 - 82.915.4 %   Platelets 201 150 - 450 x10E3/uL  Comprehensive metabolic panel  Result Value Ref Range   Glucose 84 65 - 99 mg/dL   BUN 11 6 - 24 mg/dL   Creatinine, Ser 5.620.86 0.57 - 1.00 mg/dL   GFR calc non Af Amer 74 >59 mL/min/1.73   GFR calc Af Amer 86 >59 mL/min/1.73   BUN/Creatinine Ratio 13 9 - 23   Sodium 142 134 - 144 mmol/L   Potassium 3.7 3.5 - 5.2 mmol/L   Chloride 101 96 - 106 mmol/L   CO2  24 20 - 29 mmol/L   Calcium 9.7 8.7 - 10.2 mg/dL   Total Protein 6.7 6.0 - 8.5 g/dL   Albumin 4.4 3.5 - 5.5 g/dL   Globulin, Total 2.3 1.5 - 4.5 g/dL   Albumin/Globulin Ratio 1.9 1.2 - 2.2   Bilirubin Total 0.4 0.0 - 1.2 mg/dL   Alkaline Phosphatase 93 39 - 117 IU/L   AST 28 0 - 40 IU/L   ALT 18 0 - 32 IU/L  Lipid panel  Result Value Ref Range   Cholesterol, Total 162 100 - 199 mg/dL   Triglycerides 130123 0 - 149 mg/dL   HDL 76 >86>39 mg/dL   VLDL Cholesterol Cal 25 5 - 40 mg/dL   LDL Calculated 61 0 - 99 mg/dL   Chol/HDL Ratio 2.1 0.0 - 4.4 ratio  Hemoglobin A1c  Result Value Ref Range   Hgb A1c MFr Bld 5.7 (H) 4.8 - 5.6 %   Est. average glucose Bld gHb Est-mCnc 117 mg/dL  Vitamin V78B12  Result Value Ref Range   Vitamin B-12 315 232 - 1,245 pg/mL  Folate  Result Value Ref Range   Folate 14.5 >3.0 ng/mL  Specimen status report  Result Value Ref Range   specimen status report Comment     ASSESSMENT AND PLAN: 1. Abscess Patient to continue Bactrim.  Warm compresses recommended to the area.  I have given a limited supply of tramadol to use as needed.  [place] controlled substance [place] system reviewed and is appropriate.  We will try to get her back to dermatology to have this cyst removed so that she does not have recurrence of this infected cyst  2. Epidermal cyst See #1 above - Ambulatory referral to Dermatology  3. Essential hypertension Not at goal.  DASH diet discussed and encouraged.  I recommend that she get back on blood pressure medication.  She is having some financial difficulties and cannot afford 2 medications so I have changed the Norvasc and HCTZ to lisinopril/HCTZ.  4. Prediabetes Discussed the importance of healthy eating habits and regular exercise  5. Vitamin B12 deficiency - vitamin B-12 (CYANOCOBALAMIN) 500 MCG tablet; Take 1 tablet (500 mcg total) by mouth daily.  Dispense: 100 tablet; Refill: 1  Patient was given the opportunity to ask  questions.  Patient verbalized understanding of the plan and was able to repeat key elements of the plan.   Orders Placed This Encounter  Procedures  . Ambulatory referral to Dermatology  Requested Prescriptions   Signed Prescriptions Disp Refills  . lisinopril-hydrochlorothiazide (ZESTORETIC) 10-12.5 MG tablet 45 tablet 1    Sig: Take 0.5 tablets by mouth daily.  . traMADol (ULTRAM) 50 MG tablet 15 tablet 0    Sig: Take 1 tablet (50 mg total) by mouth every 8 (eight) hours as needed for up to 5 days.  . vitamin B-12 (CYANOCOBALAMIN) 500 MCG tablet 100 tablet 1    Sig: Take 1 tablet (500 mcg total) by mouth daily.    No future appointments.  Jonah Blue, MD, FACP

## 2019-02-25 MED FILL — traMADol HCL 50 MG TABS: 50 | 5 days supply | Qty: 15 | Fill #0

## 2019-02-25 MED FILL — LISINOPRIL-HCTZ 10-12.5 MG: 10-12.5 | 30 days supply | Qty: 15 | Fill #0

## 2019-02-26 NOTE — Progress Notes (Signed)
COVID Hotel Screening performed. Temperature, PHQ-9, and need for medical care and medications assessed. Patient has been off medications and is going to pick them up today. PHQ-9=10. Behavioral Health Resources sheet given to patient. No additional needs assessed at this time.  Carlyle Basques RN MSN

## 2019-03-05 ENCOUNTER — Other Ambulatory Visit: Payer: Self-pay

## 2019-03-05 ENCOUNTER — Ambulatory Visit: Payer: Self-pay | Attending: Internal Medicine | Admitting: Emergency Medicine

## 2019-03-05 ENCOUNTER — Encounter: Payer: Self-pay | Admitting: Emergency Medicine

## 2019-03-05 VITALS — BP 138/92 | HR 96 | Temp 98.4°F | Resp 18 | Ht 65.0 in | Wt 168.3 lb

## 2019-03-05 DIAGNOSIS — Z0130 Encounter for examination of blood pressure without abnormal findings: Secondary | ICD-10-CM

## 2019-03-05 NOTE — Progress Notes (Signed)
COVID Hotel Screening performed. Temperature, PHQ-9, and need for medical care and medications assessed. No additional needs assessed at this time.  Kaiden Dardis RN MSN 

## 2019-03-05 NOTE — Progress Notes (Signed)
Patient arrived ambulatory, alert and orientated to clinic.  Patient is in clinic for COVID screening.  Patient here for a letter on a [employer] letter head stating that the patient had been screened for COVID-19 screening.  Screening performed.  Letter written and given.

## 2019-03-19 ENCOUNTER — Other Ambulatory Visit (HOSPITAL_COMMUNITY): Payer: Self-pay

## 2019-03-19 DIAGNOSIS — Z20822 Contact with and (suspected) exposure to covid-19: Secondary | ICD-10-CM

## 2019-03-20 ENCOUNTER — Other Ambulatory Visit: Payer: Self-pay

## 2019-03-20 DIAGNOSIS — Z20822 Contact with and (suspected) exposure to covid-19: Secondary | ICD-10-CM

## 2019-03-20 NOTE — Addendum Note (Signed)
Addended by: Tamatha Gadbois M on: 03/20/2019 11:38 AM   Modules accepted: Orders  

## 2019-04-16 ENCOUNTER — Telehealth: Payer: Self-pay | Admitting: Internal Medicine

## 2019-04-16 ENCOUNTER — Other Ambulatory Visit: Payer: Self-pay

## 2019-04-16 ENCOUNTER — Ambulatory Visit: Payer: Self-pay | Attending: Family Medicine | Admitting: Physician Assistant

## 2019-04-16 ENCOUNTER — Encounter: Payer: Self-pay | Admitting: Internal Medicine

## 2019-04-16 VITALS — BP 125/91 | HR 94 | Temp 98.7°F | Resp 18

## 2019-04-16 DIAGNOSIS — R2689 Other abnormalities of gait and mobility: Secondary | ICD-10-CM

## 2019-04-16 DIAGNOSIS — R059 Cough, unspecified: Secondary | ICD-10-CM

## 2019-04-16 DIAGNOSIS — I10 Essential (primary) hypertension: Secondary | ICD-10-CM

## 2019-04-16 DIAGNOSIS — R079 Chest pain, unspecified: Secondary | ICD-10-CM | POA: Insufficient documentation

## 2019-04-16 DIAGNOSIS — R7303 Prediabetes: Secondary | ICD-10-CM

## 2019-04-16 DIAGNOSIS — H81399 Other peripheral vertigo, unspecified ear: Secondary | ICD-10-CM

## 2019-04-16 DIAGNOSIS — R05 Cough: Secondary | ICD-10-CM

## 2019-04-16 MED ORDER — AMLODIPINE BESYLATE 10 MG PO TABS: 10.0000 mg | ORAL_TABLET | Freq: Every day | ORAL | 3 refills | Status: AC

## 2019-04-16 MED FILL — AMLODIPINE BESYLATE 10 MG T: 10 | 30 days supply | Qty: 30 | Fill #0

## 2019-04-16 NOTE — Telephone Encounter (Signed)
Pat

## 2019-04-16 NOTE — Telephone Encounter (Signed)
Patient came in stating that the lisinopril is causing her to feel dizzy and abdomen pain and when she got up she has been losing her balance and has fallen states she has also been having memories issues and has a bad dry cough. Please follow up

## 2019-04-16 NOTE — Progress Notes (Signed)
Patient came in feeling dizzy, coughing, and abdomen pain thinking it may be due to blood pressure medication reaction.

## 2019-04-16 NOTE — Patient Instructions (Addendum)
Call 911 if signs and symptoms worsen.   Check your blood pressure 3 times weekly and record and bring to your next visit   Dizziness Dizziness is a common problem. It makes you feel unsteady or light-headed. You may feel like you are about to pass out (faint). Dizziness can lead to getting hurt if you stumble or fall. Dizziness can be caused by many things, including:  Medicines.  Not having enough water in your body (dehydration).  Illness. Follow these instructions at home: Eating and drinking   Drink enough fluid to keep your pee (urine) clear or pale yellow. This helps to keep you from getting dehydrated. Try to drink more clear fluids, such as water.  Do not drink alcohol.  Limit how much caffeine you drink or eat, if your doctor tells you to do that.  Limit how much salt (sodium) you drink or eat, if your doctor tells you to do that. Activity   Avoid making quick movements. ? When you stand up from sitting in a chair, steady yourself until you feel okay. ? In the morning, first sit up on the side of the bed. When you feel okay, stand slowly while you hold onto something. Do this until you know that your balance is fine.  If you need to stand in one place for a long time, move your legs often. Tighten and relax the muscles in your legs while you are standing.  Do not drive or use heavy machinery if you feel dizzy.  Avoid bending down if you feel dizzy. Place items in your home so you can reach them easily without leaning over. Lifestyle  Do not use any products that contain nicotine or tobacco, such as cigarettes and e-cigarettes. If you need help quitting, ask your doctor.  Try to lower your stress level. You can do this by using methods such as yoga or meditation. Talk with your doctor if you need help. General instructions  Watch your dizziness for any changes.  Take over-the-counter and prescription medicines only as told by your doctor. Talk with your doctor if  you think that you are dizzy because of a medicine that you are taking.  Tell a friend or a family member that you are feeling dizzy. If he or she notices any changes in your behavior, have this person call your doctor.  Keep all follow-up visits as told by your doctor. This is important. Contact a doctor if:  Your dizziness does not go away.  Your dizziness or light-headedness gets worse.  You feel sick to your stomach (nauseous).  You have trouble hearing.  You have new symptoms.  You are unsteady on your feet.  You feel like the room is spinning. Get help right away if:  You throw up (vomit) or have watery poop (diarrhea), and you cannot eat or drink anything.  You have trouble: ? Talking. ? Walking. ? Swallowing. ? Using your arms, hands, or legs.  You feel generally weak.  You are not thinking clearly, or you have trouble forming sentences. A friend or family member may notice this.  You have: ? Chest pain. ? Pain in your belly (abdomen). ? Shortness of breath. ? Sweating.  Your vision changes.  You are bleeding.  You have a very bad headache.  You have neck pain or a stiff neck.  You have a fever. These symptoms may be an emergency. Do not wait to see if the symptoms will go away. Get medical help right away.  Call your local emergency services (911 in the [place] Do not drive yourself to the hospital. Summary  Dizziness makes you feel unsteady or light-headed. You may feel like you are about to pass out (faint).  Drink enough fluid to keep your pee (urine) clear or pale yellow. Do not drink alcohol.  Avoid making quick movements if you feel dizzy.  Watch your dizziness for any changes. This information is not intended to replace advice given to you by your health care provider. Make sure you discuss any questions you have with your health care provider. Document Released: 09/20/2011 Document Revised: 10/04/2017 Document Reviewed: 10/18/2016 Elsevier  Patient Education  2020 [employer]

## 2019-04-16 NOTE — Progress Notes (Signed)
Patient ID: Carla Yu, female   DOB: 1961/03/20, 58 y.o.   MRN: [id number]   Carla Yu, is a 58 y.o. female  [id number]:[id number]  KDX:[id number]  DOB - July 28, 1961  Subjective:  Chief Complaint and HPI: Carla Yu is a 58 y.o. female here today with multiple issues and worked into schedule to be seen acutely.  Patient has been not feeling well since starting lisinopril/HCT about 1 month ago.  C/o intermittent dizziness, esp when changing positions.  She has been off balance a couple of times and has had minor stumbles/falls.  Sometimes she feels she is forgetful.  +some HA.  Also having a moderate to severe cough at night since starting lisinopril.  Appetite is good.  No changes in BM.  She does have occasional sharp, intermittent CP.  No SOB.    ROS:   Constitutional:  No f/c, No night sweats, No unexplained weight loss. EENT:  No vision changes, No blurry vision, No hearing changes. No mouth, throat, or ear problems.  Respiratory: + cough, No SOB Cardiac: No CP, no palpitations GI:  No abd pain, No N/V/D. GU: No Urinary s/sx Musculoskeletal: No joint pain Neuro: some headache, mild dizziness, no motor weakness.  Skin: No rash Endocrine:  No polydipsia. No polyuria.  Psych: Denies SI/HI  No problems updated.  ALLERGIES: Allergies  Allergen Reactions  . Ibuprofen Nausea Only and Palpitations  . Lisinopril Cough    PAST MEDICAL HISTORY: Past Medical History:  Diagnosis Date  . Anemia     MEDICATIONS AT HOME: Prior to Admission medications   Medication Sig Start Date End Date Taking? Authorizing Provider  amLODipine (NORVASC) 10 MG tablet Take 1 tablet (10 mg total) by mouth daily. 04/16/19   Argentina Donovan, PA-C  vitamin B-12 (CYANOCOBALAMIN) 500 MCG tablet Take 1 tablet (500 mcg total) by mouth daily. Patient not taking: Reported on 03/05/2019 02/18/19   Ladell Pier, MD     Objective:  EXAM:   Vitals:   04/16/19 1540  BP: (!) 125/91   Pulse: 94  Resp: 18  Temp: 98.7 F (37.1 C)  TempSrc: Oral  SpO2: 99%    General appearance : A&OX3. NAD. Non-toxic-appearing HEENT: Atraumatic and Normocephalic.  PERRLA. EOM intact.  TM clear B. Mouth-MMM, post pharynx WNL w/o erythema, No PND. Neck: supple, no JVD. No cervical lymphadenopathy. No thyromegaly Chest/Lungs:  Breathing-non-labored, Good air entry bilaterally, breath sounds normal without rales, rhonchi, or wheezing  CVS: S1 S2 regular, no murmurs, gallops, rubs  Extremities: Bilateral Lower Ext shows no edema, both legs are warm to touch with = pulse throughout Neurology:  CN II-XII grossly intact, Non focal.  Neg rhomberg.  Cerebellar function intact.  Finger to nose/heel to shin WNL.  Normal gait Psych:  TP linear. J/I fair. Normal speech. Appropriate eye contact and affect.  Skin:  No Rash  Data Review Lab Results  Component Value Date   HGBA1C 5.7 (H) 06/30/2018   HGBA1C 5.6 11/30/2016     Assessment & Plan   1. Essential hypertension Controlled but doesn't tolerate lisinopril.  Check BP OOO 3 times/week and bring to next visit.  Stop Lisinopril/HCT - amLODipine (NORVASC) 10 MG tablet; Take 1 tablet (10 mg total) by mouth daily.  Dispense: 90 tablet; Refill: 3 - Comprehensive metabolic panel - CBC with Differential/Platelet - TSH  2. Other peripheral vertigo, unspecified ear Call 911 if worsens.  No central signs today - Ambulatory referral to Neurology - CT Head Wo Contrast; Future -  CBC with Differential/Platelet - TSH  3. Balance problem Call 911 if worsens, no red flags today - Ambulatory referral to Neurology - Comprehensive metabolic panel - CBC with Differential/Platelet  4. Prediabetes Will assess today  5. Cough Stop lisinopril  6. Chest pain, unspecified type No ST changes/ischemia on EKG.  CP warnings-call 911 if worsens.   Patient have been counseled extensively about nutrition and exercise  Return in about 3 weeks (around  05/07/2019) for Dr Laural BenesJohnson f/up med change.  The patient was given clear instructions to go to ER or return to medical center if symptoms don't improve, worsen or new problems develop. The patient verbalized understanding. The patient was told to call to get lab results if they haven't heard anything in the next week.     Georgian CoAngela McClung, PA-C [facility] [place], [place] [phone]   04/16/2019, 3:57 PM

## 2019-04-17 LAB — COMPREHENSIVE METABOLIC PANEL
ALT: 16 IU/L (ref 0–32)
AST: 24 IU/L (ref 0–40)
Albumin/Globulin Ratio: 1.8 (ref 1.2–2.2)
Albumin: 4.8 g/dL (ref 3.8–4.9)
Alkaline Phosphatase: 107 IU/L (ref 39–117)
BUN/Creatinine Ratio: 18 (ref 12–28)
BUN: 19 mg/dL (ref 8–27)
Bilirubin Total: 0.3 mg/dL (ref 0.0–1.2)
CO2: 24 mmol/L (ref 20–29)
Calcium: 9.8 mg/dL (ref 8.7–10.3)
Chloride: 104 mmol/L (ref 96–106)
Creatinine, Ser: 1.05 mg/dL — ABNORMAL HIGH (ref 0.57–1.00)
GFR calc Af Amer: 67 mL/min/{1.73_m2} (ref >59)
GFR calc non Af Amer: 58 mL/min/{1.73_m2} — ABNORMAL LOW (ref >59)
Globulin, Total: 2.7 g/dL (ref 1.5–4.5)
Glucose: 91 mg/dL (ref 65–99)
Potassium: 4.4 mmol/L (ref 3.5–5.2)
Sodium: 147 mmol/L — ABNORMAL HIGH (ref 134–144)
Total Protein: 7.5 g/dL (ref 6.0–8.5)

## 2019-04-17 LAB — CBC WITH DIFFERENTIAL/PLATELET
Basophils Absolute: 0.1 10*3/uL (ref 0.0–0.2)
Basos: 1 %
EOS (ABSOLUTE): 0.2 10*3/uL (ref 0.0–0.4)
Eos: 3 %
Hematocrit: 42 % (ref 34.0–46.6)
Hemoglobin: 12.9 g/dL (ref 11.1–15.9)
Immature Grans (Abs): 0 10*3/uL (ref 0.0–0.1)
Immature Granulocytes: 0 %
Lymphocytes Absolute: 2.7 10*3/uL (ref 0.7–3.1)
Lymphs: 41 %
MCH: 23.6 pg — ABNORMAL LOW (ref 26.6–33.0)
MCHC: 30.7 g/dL — ABNORMAL LOW (ref 31.5–35.7)
MCV: 77 fL — ABNORMAL LOW (ref 79–97)
Monocytes Absolute: 0.4 10*3/uL (ref 0.1–0.9)
Monocytes: 6 %
Neutrophils Absolute: 3.2 10*3/uL (ref 1.4–7.0)
Neutrophils: 49 %
Platelets: 231 10*3/uL (ref 150–450)
RBC: 5.47 x10E6/uL — ABNORMAL HIGH (ref 3.77–5.28)
RDW: 14.9 % (ref 11.7–15.4)
WBC: 6.5 10*3/uL (ref 3.4–10.8)

## 2019-04-17 LAB — TSH: TSH: 2.94 u[IU]/mL (ref 0.450–4.500)

## 2019-04-23 ENCOUNTER — Other Ambulatory Visit: Payer: Self-pay | Admitting: *Deleted

## 2019-04-23 DIAGNOSIS — Z20822 Contact with and (suspected) exposure to covid-19: Secondary | ICD-10-CM

## 2019-04-24 ENCOUNTER — Telehealth: Payer: Self-pay | Admitting: *Deleted

## 2019-04-24 NOTE — Telephone Encounter (Signed)
Medical Assistant left message on patient's home and cell voicemail. Voicemail states to give a call back to [place] with [facility] at [phone]. Patient is aware of 4:10 appointment being a Tele-visit.

## 2019-04-25 NOTE — Addendum Note (Signed)
Addended by: Brigitte Pulse on: 04/25/2019 09:31 AM   Modules accepted: Orders

## 2019-04-27 ENCOUNTER — Other Ambulatory Visit: Payer: Self-pay

## 2019-04-27 ENCOUNTER — Encounter: Payer: Self-pay | Admitting: Internal Medicine

## 2019-04-27 ENCOUNTER — Ambulatory Visit: Payer: Self-pay | Attending: Internal Medicine | Admitting: Internal Medicine

## 2019-04-27 DIAGNOSIS — G3281 Cerebellar ataxia in diseases classified elsewhere: Secondary | ICD-10-CM

## 2019-04-27 DIAGNOSIS — R531 Weakness: Secondary | ICD-10-CM

## 2019-04-27 DIAGNOSIS — I10 Essential (primary) hypertension: Secondary | ICD-10-CM

## 2019-04-27 DIAGNOSIS — R05 Cough: Secondary | ICD-10-CM

## 2019-04-27 NOTE — Progress Notes (Signed)
Virtual Visit via Telephone Note Due to current restrictions/limitations of in-office visits due to the COVID-19 pandemic, this scheduled clinical appointment was converted to a telehealth visit  I connected with Carla Yu on 04/27/19 at 4:25 p.m by telephone and verified that I am speaking with the correct person using two identifiers. I am in my office.  The patient is at home.  Only the patient and myself participated in this encounter.  I discussed the limitations, risks, security and privacy concerns of performing an evaluation and management service by telephone and the availability of in person appointments. I also discussed with the patient that there may be a patient responsible charge related to this service. The patient expressed understanding and agreed to proceed.   History of Present Illness: Patient with history of HTN, obesity, prediabetes, B12 deficiency.  HTN: On last visit with me, patient was having difficulty affording her blood pressure medications which were HCTZ and Norvasc.  Therefore medication was changed to lisinopril HCTZ.  Pt seen by PA the early part of this mth complaining of dizziness with position changes, now follows and moderate to severe cough at night since being on the lisinopril/HCTZ.  This medication was discontinued and patient placed on Norvasc 10 mg.  CAT scan of the head was ordered and she was referred to neurology. Cough is going away, "not as bad as it was." No device to check BP.  Dizziness is better.   Patient states that she was called by neurology but she currently does not have the orange card or cone discount.  They told her to call back when she is approved.  She has not had a CAT scan of the head as yet either. -She tells me that her right leg and arm feel weak and heavy.  She feels like she drags the right leg.  She tells me that the symptoms have been present for 2 weeks and that this was the reason she was referred to the  neurologist by the physician assistant.  On review of the PAs note, her neurologic exam was nonfocal.   Observations/Objective: Results for orders placed or performed in visit on 04/16/19  Comprehensive metabolic panel  Result Value Ref Range   Glucose 91 65 - 99 mg/dL   BUN 19 8 - 27 mg/dL   Creatinine, Ser 1.05 (H) 0.57 - 1.00 mg/dL   GFR calc non Af Amer 58 (L) >59 mL/min/1.73   GFR calc Af Amer 67 >59 mL/min/1.73   BUN/Creatinine Ratio 18 12 - 28   Sodium 147 (H) 134 - 144 mmol/L   Potassium 4.4 3.5 - 5.2 mmol/L   Chloride 104 96 - 106 mmol/L   CO2 24 20 - 29 mmol/L   Calcium 9.8 8.7 - 10.3 mg/dL   Total Protein 7.5 6.0 - 8.5 g/dL   Albumin 4.8 3.8 - 4.9 g/dL   Globulin, Total 2.7 1.5 - 4.5 g/dL   Albumin/Globulin Ratio 1.8 1.2 - 2.2   Bilirubin Total 0.3 0.0 - 1.2 mg/dL   Alkaline Phosphatase 107 39 - 117 IU/L   AST 24 0 - 40 IU/L   ALT 16 0 - 32 IU/L  CBC with Differential/Platelet  Result Value Ref Range   WBC 6.5 3.4 - 10.8 x10E3/uL   RBC 5.47 (H) 3.77 - 5.28 x10E6/uL   Hemoglobin 12.9 11.1 - 15.9 g/dL   Hematocrit 42.0 34.0 - 46.6 %   MCV 77 (L) 79 - 97 fL   MCH 23.6 (L) 26.6 -  33.0 pg   MCHC 30.7 (L) 31.5 - 35.7 g/dL   RDW 40.914.9 81.111.7 - 91.415.4 %   Platelets 231 150 - 450 x10E3/uL   Neutrophils 49 Not Estab. %   Lymphs 41 Not Estab. %   Monocytes 6 Not Estab. %   Eos 3 Not Estab. %   Basos 1 Not Estab. %   Neutrophils Absolute 3.2 1.4 - 7.0 x10E3/uL   Lymphocytes Absolute 2.7 0.7 - 3.1 x10E3/uL   Monocytes Absolute 0.4 0.1 - 0.9 x10E3/uL   EOS (ABSOLUTE) 0.2 0.0 - 0.4 x10E3/uL   Basophils Absolute 0.1 0.0 - 0.2 x10E3/uL   Immature Granulocytes 0 Not Estab. %   Immature Grans (Abs) 0.0 0.0 - 0.1 x10E3/uL  TSH  Result Value Ref Range   TSH 2.940 0.450 - 4.500 uIU/mL   Assessment and Plan: 1. Essential hypertension -Continue amlodipine.  I will bring her in later this week for blood pressure recheck and to do a neurologic assessment.  Low-salt diet  encouraged.  2. Right sided weakness I had my CMA schedule a CAT scan of her head for tomorrow and then she will follow-up with me later this week.  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 The patient was provided an opportunity to ask questions and all were answered. The patient agreed with the plan and demonstrated an understanding of the instructions.   The patient was advised to call back or seek an in-person evaluation if the symptoms worsen or if the condition fails to improve as anticipated.  I provided 12 minutes of non-face-to-face time during this encounter.   Jonah Blueeborah Johnson, MD

## 2019-04-28 ENCOUNTER — Other Ambulatory Visit: Payer: Self-pay

## 2019-04-28 ENCOUNTER — Ambulatory Visit (HOSPITAL_COMMUNITY)
Admission: RE | Admit: 2019-04-28 | Discharge: 2019-04-28 | Disposition: A | Discharge status: Home / Self Care | Payer: Self-pay | Source: Ambulatory Visit | Attending: Internal Medicine | Admitting: Internal Medicine

## 2019-04-28 DIAGNOSIS — R531 Weakness: Secondary | ICD-10-CM | POA: Insufficient documentation

## 2019-04-28 DIAGNOSIS — G3281 Cerebellar ataxia in diseases classified elsewhere: Secondary | ICD-10-CM | POA: Insufficient documentation

## 2019-04-30 ENCOUNTER — Other Ambulatory Visit: Payer: Self-pay

## 2019-04-30 ENCOUNTER — Encounter: Payer: Self-pay | Admitting: Internal Medicine

## 2019-04-30 ENCOUNTER — Ambulatory Visit: Payer: Self-pay | Attending: Internal Medicine | Admitting: Internal Medicine

## 2019-04-30 VITALS — BP 160/102 | HR 64 | Temp 98.4°F | Resp 16 | Wt 181.8 lb

## 2019-04-30 DIAGNOSIS — Z1239 Encounter for other screening for malignant neoplasm of breast: Secondary | ICD-10-CM

## 2019-04-30 DIAGNOSIS — I10 Essential (primary) hypertension: Secondary | ICD-10-CM

## 2019-04-30 DIAGNOSIS — E538 Deficiency of other specified B group vitamins: Secondary | ICD-10-CM

## 2019-04-30 DIAGNOSIS — G6289 Other specified polyneuropathies: Secondary | ICD-10-CM

## 2019-04-30 DIAGNOSIS — K029 Dental caries, unspecified: Secondary | ICD-10-CM

## 2019-04-30 DIAGNOSIS — E870 Hyperosmolality and hypernatremia: Secondary | ICD-10-CM

## 2019-04-30 MED ORDER — HYDRALAZINE HCL 25 MG PO TABS: 25.0000 mg | ORAL_TABLET | Freq: Two times a day (BID) | ORAL | 5 refills | Status: AC

## 2019-04-30 MED ORDER — CYANOCOBALAMIN 500 MCG PO TABS: 1000.0000 ug | ORAL_TABLET | Freq: Every day | ORAL | 5 refills | Status: AC

## 2019-04-30 MED FILL — hydrALAZINE HCL 25 MG TABS: 25 | 30 days supply | Qty: 60 | Fill #0

## 2019-04-30 NOTE — Patient Instructions (Addendum)
Please give patient an appointment with the clinical pharmacist in 2 weeks for repeat blood pressure check.    I have referred you to the dentist.  We have added a new blood pressure medication called hydralazine which she will take twice a day.  Continue taking the amlodipine also.  Try to limit salt in the foods as much as possible.  You should aim to drink several glasses of water daily.  You should take a daily baby aspirin which is the 81 mg.  Increase your vitamin B12 supplement to 2 tablets daily.

## 2019-04-30 NOTE — Progress Notes (Signed)
Pt states when she walks a lot she has discomfort in her right hip

## 2019-04-30 NOTE — Progress Notes (Signed)
Patient ID: Carla Yu, female    DOB: 1961-09-03  MRN: [id number]  CC:RT side weakness  Subjective: Carla Yu is a 58 y.o. female who presents for UC visit for f/u weakness Her concerns today include:  Patient with history of HTN, obesity, prediabetes, B12 deficiency.  When I spoke with the patient via telephone 3 days ago, she complained of right-sided weakness x2 weeks.  -CAT scan of the head negative for any acute findings.  I requested that she come for an in person visit today.  Today she maintains that her right side is weak and a little numb.  She tells me that she has neuropathy in her feet for which we had prescribed gabapentin in the past.  Prior to that, she states that a physician in [place] had also diagnosed her with the same.  In September 2019 she complains of some neuropathy symptoms in the feet and was found to be prediabetic and vitamin B-12 in the low normal range.  I recommended that she start B12 supplement which she has been taking 500 mcg daily -Recent blood test done by PA revealed creatinine slightly elevated with sodium of 147.  Of note she was on lisinopril/HCTZ at that time which has since been discontinued.  Patient was told to drink more water.  She is requesting a note for work allowing her to have more frequent bathroom breaks because she will urinate more once she starts drinking more water.  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  HTN: Blood pressure noted to be elevated today.  She reports compliance with Norvasc which he takes at nights.    Homeless.  Staying in motel since March 2020 [facility] [employer] [facility] [facility] [employer] working with her to try to find permanent housing.  Patient Active Problem List   Diagnosis Date Noted  . Prediabetes 02/18/2019  . Vitamin B12 deficiency 02/18/2019  . Peripheral neuropathy 06/30/2018  . Epidermal cyst 06/30/2018  . Essential  hypertension 12/14/2016     Current Outpatient Medications on File Prior to Visit  Medication Sig Dispense Refill  . amLODipine (NORVASC) 10 MG tablet Take 1 tablet (10 mg total) by mouth daily. 90 tablet 3   No current facility-administered medications on file prior to visit.     Allergies  Allergen Reactions  . Ibuprofen Nausea Only and Palpitations  . Lisinopril Cough    Social History   Socioeconomic History  . Marital status: Single    Spouse name: Not on file  . Number of children: Not on file  . Years of education: Not on file  . Highest education level: Not on file  Occupational History  . Not on file  Social Needs  . Financial resource strain: Not on file  . Food insecurity    Worry: Not on file    Inability: Not on file  . Transportation needs    Medical: Not on file    Non-medical: Not on file  Tobacco Use  . Smoking status: Former Smoker    Types: Cigarettes    Quit date: 10/15/2001    Years since quitting: 17.5  . Smokeless tobacco: Never Used  Substance and Sexual Activity  . Alcohol use: No  . Drug use: No  . Sexual activity: Not Currently    Birth control/protection: None  Lifestyle  . Physical activity    Days per week: Not on file  Minutes per session: Not on file  . Stress: Not on file  Relationships  . Social Herbalist on phone: Not on file    Gets together: Not on file    Attends religious service: Not on file    Active member of club or organization: Not on file    Attends meetings of clubs or organizations: Not on file    Relationship status: Not on file  . Intimate partner violence    Fear of current or ex partner: Not on file    Emotionally abused: Not on file    Physically abused: Not on file    Forced sexual activity: Not on file  Other Topics Concern  . Not on file  Social History Narrative  . Not on file    Family History  Problem Relation Age of Onset  . Depression Mother   . Hypertension Mother   .  Cancer Father     No past surgical history on file.  ROS: Review of Systems Negative except as stated above  PHYSICAL EXAM: BP (!) 160/102   Pulse 64   Temp 98.4 F (36.9 C) (Oral)   Resp 16   Wt 181 lb 12.8 oz (82.5 kg)   SpO2 100%   BMI 30.25 kg/m   Physical Exam  General appearance - alert, well appearing, and in no distress Mental status - normal mood, behavior, speech, dress, motor activity, and thought processes Neck: No carotid bruits heard Mouth: She only has 5 teeth in the mouth and they all seem to be decayed Chest - clear to auscultation, no wheezes, rales or rhonchi, symmetric air entry Heart - normal rate, regular rhythm, normal S1, S2, no murmurs, rubs, clicks or gallops Neurological - cranial nerves II through XII intact, DTR's normal and symmetric, Romberg sign negative, normal gait and station.  Power 5/5 in UEs and LEs.  Decrease sensation RT lower leg compared to LT.  LEAP abn on soles BL   CMP Latest Ref Rng & Units 04/16/2019 06/30/2018 11/30/2016  Glucose 65 - 99 mg/dL 91 84 82  BUN 8 - 27 mg/dL 19 11 14   Creatinine 0.57 - 1.00 mg/dL 1.05(H) 0.86 0.80  Sodium 134 - 144 mmol/L 147(H) 142 142  Potassium 3.5 - 5.2 mmol/L 4.4 3.7 3.8  Chloride 96 - 106 mmol/L 104 101 107  CO2 20 - 29 mmol/L 24 24 22   Calcium 8.7 - 10.3 mg/dL 9.8 9.7 9.9  Total Protein 6.0 - 8.5 g/dL 7.5 6.7 7.7  Total Bilirubin 0.0 - 1.2 mg/dL 0.3 0.4 0.5  Alkaline Phos 39 - 117 IU/L 107 93 92  AST 0 - 40 IU/L 24 28 23   ALT 0 - 32 IU/L 16 18 18    Lipid Panel     Component Value Date/Time   CHOL 162 06/30/2018 1136   TRIG 123 06/30/2018 1136   HDL 76 06/30/2018 1136   CHOLHDL 2.1 06/30/2018 1136   CHOLHDL 2.2 11/30/2016 0941   VLDL 27 11/30/2016 0941   LDLCALC 61 06/30/2018 1136    CBC    Component Value Date/Time   WBC 6.5 04/16/2019 1614   WBC 4.9 11/30/2016 0941   RBC 5.47 (H) 04/16/2019 1614   RBC 5.35 (H) 11/30/2016 0941   HGB 12.9 04/16/2019 1614   HCT 42.0  04/16/2019 1614   PLT 231 04/16/2019 1614   MCV 77 (L) 04/16/2019 1614   MCH 23.6 (L) 04/16/2019 1614   MCH 23.2 (L) 11/30/2016 2458  MCHC 30.7 (L) 04/16/2019 1614   MCHC 30.8 (L) 11/30/2016 0941   RDW 14.9 04/16/2019 1614   LYMPHSABS 2.7 04/16/2019 1614   MONOABS 196 (L) 11/30/2016 0941   EOSABS 0.2 04/16/2019 1614   BASOSABS 0.1 04/16/2019 1614    ASSESSMENT AND PLAN: 1. Other polyneuropathy Patient with some neuropathy in the feet and subjective numbness in the right lower leg.  Her neurologic exam does not reveal any weakness on the right side and gait is normal.  At this time I recommend taking a baby aspirin daily which she can purchase over-the-counter. -Neuropathy in feet may be due to fine fiber diseases may be seen in prediabetes or could be due to worsening B12 deficiency but she tells me that she has been taking the 500 mcg daily of vitamin B12.  She declines having blood test done today to recheck B12 level.  I recommend increasing the B12 1000 mcg daily.  2. Vitamin B 12 deficiency See #1 above - vitamin B-12 (CYANOCOBALAMIN) 500 MCG tablet; Take 2 tablets (1,000 mcg total) by mouth daily.  Dispense: 120 tablet; Refill: 5  3. Essential hypertension Uncontrolled.  She reports compliance with Norvasc 10 mg.  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  Dispense: 60 tablet; Refill: 5  4. Hypernatremia Encouraged to drink several glasses of water daily.  Note given for work.  5. Breast cancer screening - MM Digital Screening; Future  6. Dental cavities - Ambulatory referral to  Dentistry     Patient was given the opportunity to ask questions.  Patient verbalized understanding of the plan and was able to repeat key elements of the plan.   Orders Placed This Encounter  Procedures  . MM Digital Screening  . Ambulatory referral to Dentistry     Requested Prescriptions   Signed Prescriptions Disp Refills  . hydrALAZINE (APRESOLINE) 25 MG tablet 60 tablet 5    Sig: Take 1 tablet (25 mg total) by mouth 2 (two) times daily.  . vitamin B-12 (CYANOCOBALAMIN) 500 MCG tablet 120 tablet 5    Sig: Take 2 tablets (1,000 mcg total) by mouth daily.    Return in about 3 months (around 07/31/2019).  Jonah Blueeborah Joslynne Klatt, MD, FACP

## 2019-05-14 ENCOUNTER — Encounter: Payer: Self-pay | Admitting: Pharmacist

## 2019-05-14 ENCOUNTER — Other Ambulatory Visit: Payer: Self-pay

## 2019-05-14 ENCOUNTER — Ambulatory Visit: Payer: Self-pay | Attending: Internal Medicine | Admitting: Pharmacist

## 2019-05-14 VITALS — BP 119/81 | HR 69

## 2019-05-14 DIAGNOSIS — I10 Essential (primary) hypertension: Secondary | ICD-10-CM

## 2019-05-14 NOTE — Patient Instructions (Signed)

## 2019-05-14 NOTE — Progress Notes (Signed)
   S:    PCP: Dr. Wynetta Emery  Patient arrives in good spirits. Presents to the clinic for BP check. Patient was referred and last seen by Primary Care Provider on 04/30/19. BP 160/102 at that appt. Dr. Wynetta Emery started hydralazine.  Patient reports adherence with medications.  Current BP Medications include:  Amlodipine 10 mg daily, hydralazine 25 mg BID  Antihypertensives tried in the past include: lisinopril (cough), lisinopril-HCTZ (electrolyte abnormality), HCTZ (stopped d/t cost - this was changed to combination HCTZ-lisinopril)  Dietary habits include: compliant with salt restriction; drinks 1 cup of coffee/every other day Exercise habits include: walks daily at her job site Family / Social history:   - FHx: HTN (mother) - Smokes rarely if she's around other people who smoke - last use was ~1 week ago - Alcohol: denies use  O:  L arm after 5 minutes rest: 119/81, HR 69  Home BP readings: no device to check  Last 3 Office BP readings: BP Readings from Last 3 Encounters:  05/14/19 119/81  04/30/19 (!) 160/102  04/16/19 (!) 125/91   BMET    Component Value Date/Time   NA 147 (H) 04/16/2019 1614   K 4.4 04/16/2019 1614   CL 104 04/16/2019 1614   CO2 24 04/16/2019 1614   GLUCOSE 91 04/16/2019 1614   GLUCOSE 82 11/30/2016 0941   BUN 19 04/16/2019 1614   CREATININE 1.05 (H) 04/16/2019 1614   CREATININE 0.80 11/30/2016 0941   CALCIUM 9.8 04/16/2019 1614   GFRNONAA 58 (L) 04/16/2019 1614   GFRNONAA 82 11/30/2016 0941   GFRAA 67 04/16/2019 1614   GFRAA >89 11/30/2016 0941   Renal function: CrCl cannot be calculated (Patient's most recent lab result is older than the maximum 21 days allowed.).  Clinical ASCVD: No  The 10-year ASCVD risk score Mikey Bussing DC Jr., et al., 2013) is: 3.8%   Values used to calculate the score:     Age: 58 years     Sex: Female     Is Non-Hispanic African American: Yes     Diabetic: No     Tobacco smoker: No     Systolic Blood Pressure: 809 mmHg      Is BP treated: Yes     HDL Cholesterol: 76 mg/dL     Total Cholesterol: 162 mg/dL  A/P: Hypertension longstanding currently at goal on current medications. BP Goal <130/80 mmHg. Patient is adherent with current medications.   -Continued current regimen. -Counseling given regarding complete cessation of smoking. -Counseled on lifestyle modifications for blood pressure control including reduced dietary sodium, increased exercise, adequate sleep. -Pt to obtain home BP cuff; advised to check occasionally and record her BP at home to discuss at future visits.  Results reviewed and written information provided. Total time in face-to-face counseling 15 minutes.   F/U Clinic Visit in 1 month.   Benard Halsted, PharmD, [facility] [phone]

## 2019-06-05 ENCOUNTER — Ambulatory Visit: Payer: Medicaid Other | Admitting: Pharmacist

## 2019-06-12 ENCOUNTER — Ambulatory Visit: Payer: Medicaid Other | Admitting: Pharmacist

## 2019-07-07 NOTE — Progress Notes (Signed)
COVID-19 Screening performed. Temperature, PHQ-9, and need for medical care and medications assessed. No additional needs assessed at this time.  Larayah Clute MSN, RN 

## 2019-07-08 NOTE — Progress Notes (Signed)
COVID-19 Screening performed. Temperature, PHQ-9, and need for medical care and medications assessed. No additional needs assessed at this time.  Kaulin Chaves MSN, RN 

## 2019-10-06 IMAGING — CT CT HEAD WITHOUT CONTRAST
3 series · 15 of 47 positions shown, 18 images · non-contrast
Comparison: None.

CLINICAL DATA: Ataxia, right-sided weakness and dizziness

EXAM:
CT HEAD WITHOUT CONTRAST
TECHNIQUE: Contiguous axial images were obtained from the base of the skull
through the vertex without intravenous contrast.

[Series 2: head wo · axial · 0.47mm/px · z∈[-101,+24]mm · 9 of 31 slices shown, 12 images]
[im 3/31  brain]
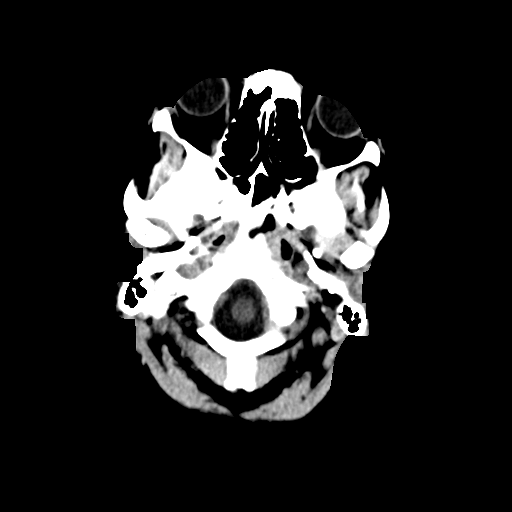
[im 3/31  bone]
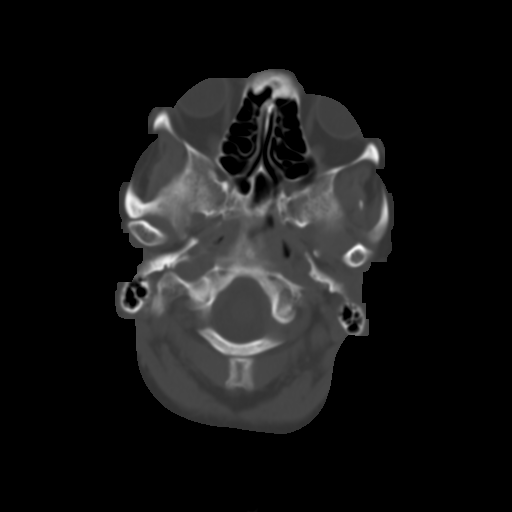
[im 6/31  brain]
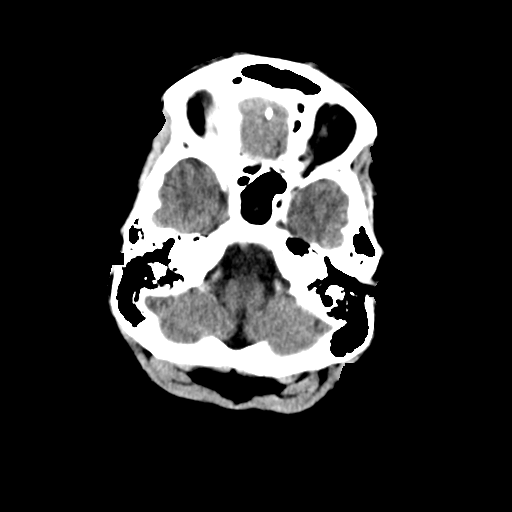
[im 9/31  brain]
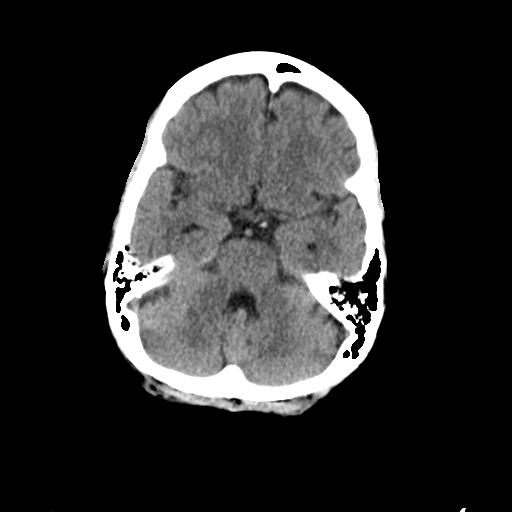
[im 12/31  brain]
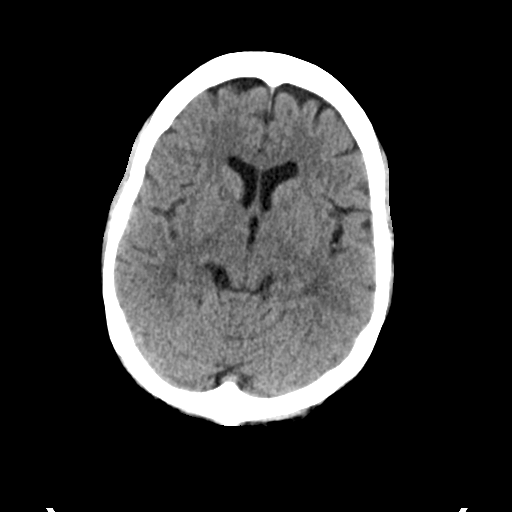
[im 16/31  brain]
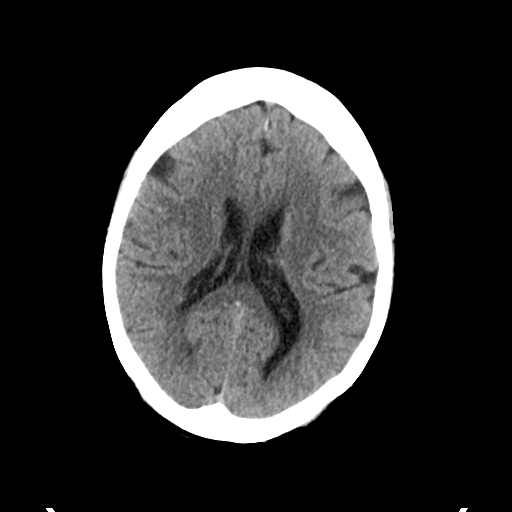
[im 16/31  bone]
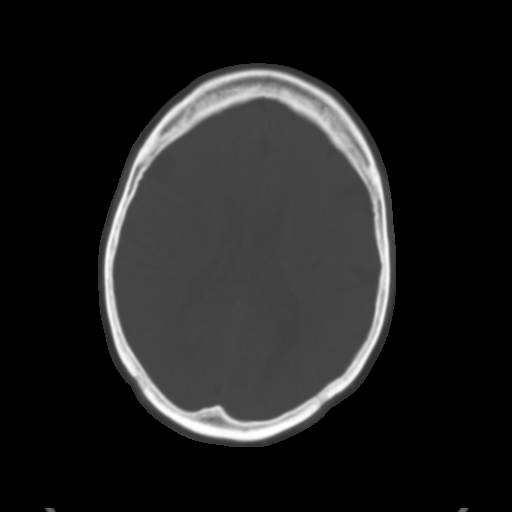
[im 19/31  brain]
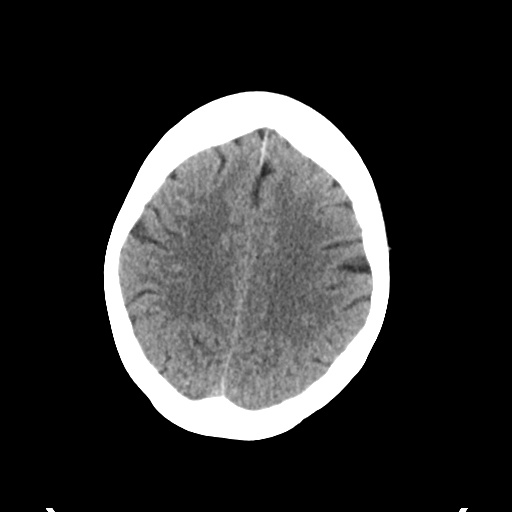
[im 22/31  brain]
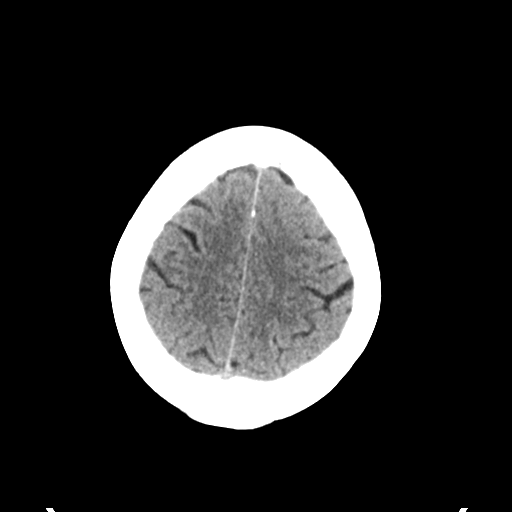
[im 25/31  brain]
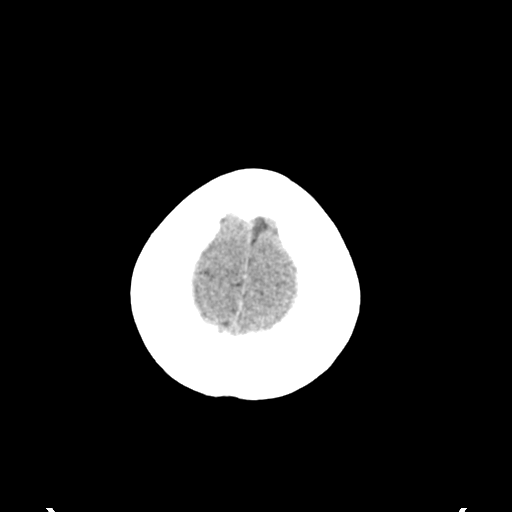
[im 28/31  brain]
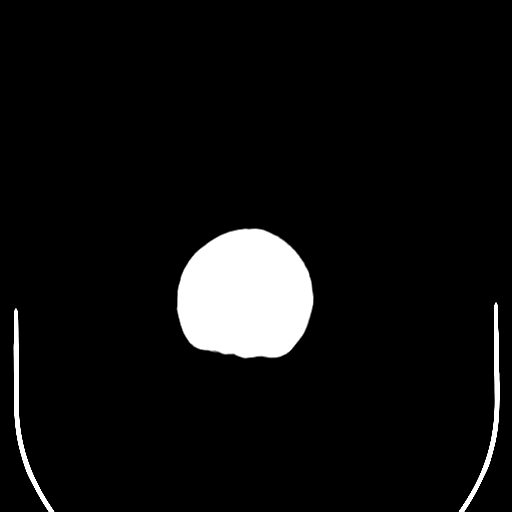
[im 28/31  bone]
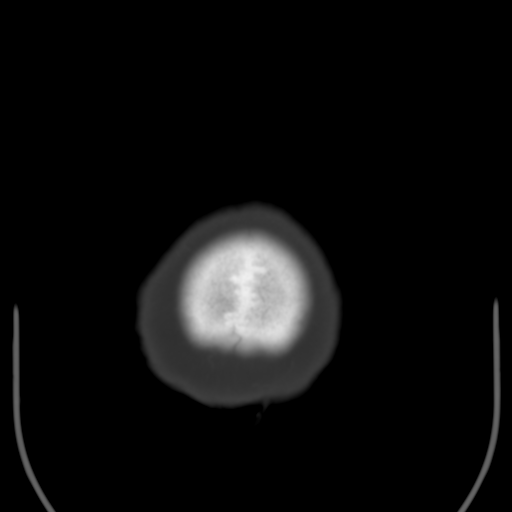

[Series 4: coronal soft tissue · coronal · 0.34mm/px · 3 of 70 slices shown]
[im 24/70  brain]
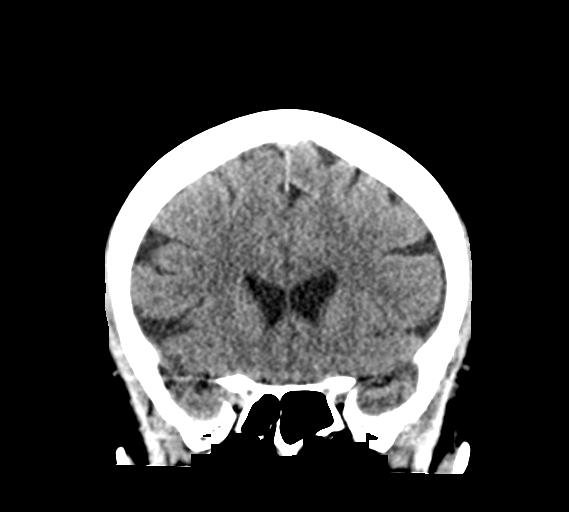
[im 31/70  brain]
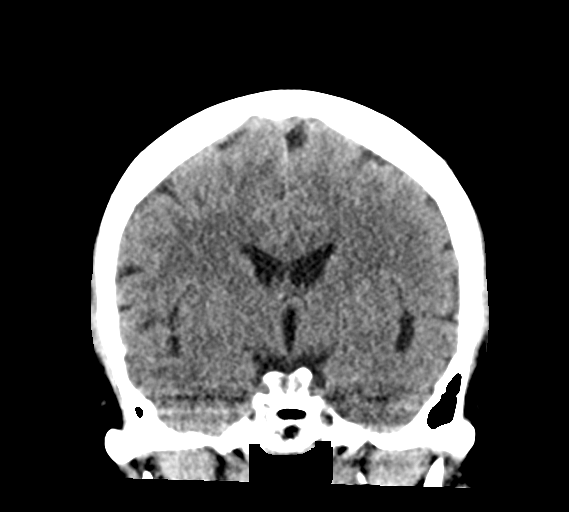
[im 39/70  brain]
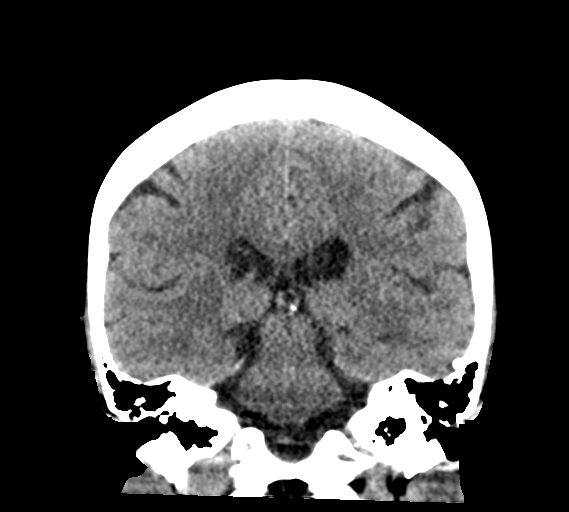

[Series 5: sagittal soft tissue · sagittal · 0.32mm/px · 3 of 58 slices shown]
[im 20/58  brain]
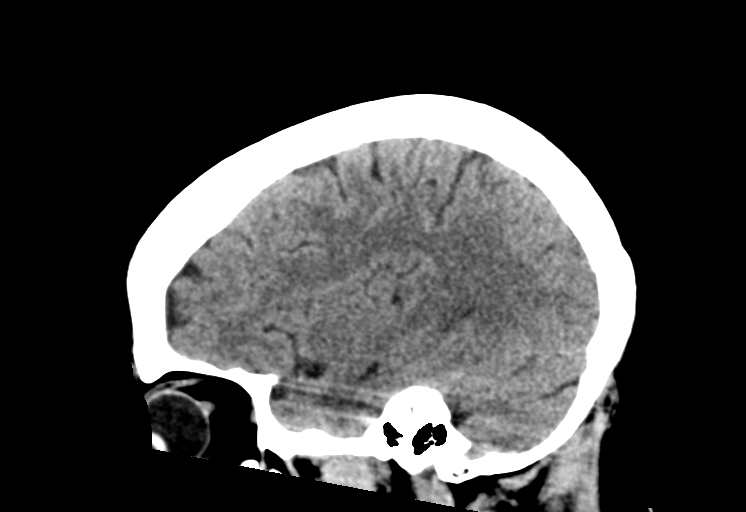
[im 29/58  brain]
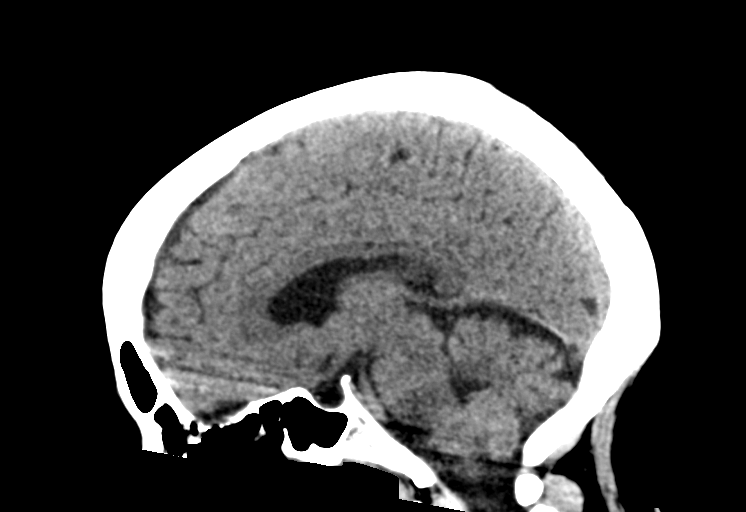
[im 39/58  brain]
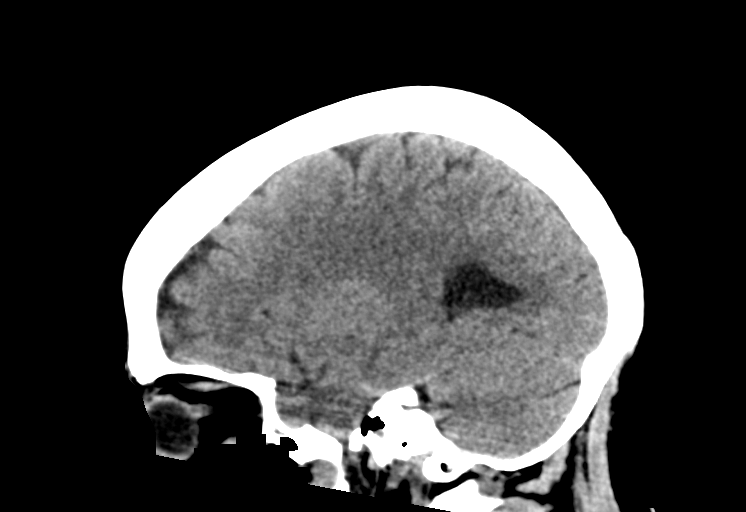

[15 of 47 positions shown; findings below may reference images not displayed]

FINDINGS: Brain: No evidence of acute infarction, hemorrhage, hydrocephalus,
extra-axial collection or mass lesion/mass effect.

Vascular: No hyperdense vessel or unexpected calcification.

Skull: Normal. Negative for fracture or focal lesion.

Sinuses/Orbits: No acute finding.

Other: None.
IMPRESSION: No acute intracranial pathology.

## 2020-09-13 LAB — HEMOGLOBIN A1C - EXTERNAL
Estimated Mean Glucose: 123 mg/dL
Hemoglobin A1C: 5.9 % — ABNORMAL HIGH (ref <5.7)

## 2021-09-01 ENCOUNTER — Telehealth: Payer: Self-pay | Admitting: Internal Medicine

## 2021-09-01 NOTE — Telephone Encounter (Signed)
Copied from CRM [id number]. Topic: General - Inquiry >> Aug 29, 2021  9:57 AM Aretta Nip wrote: Reason for CRM: Pt is stating that [phone] Carla Yu / pt states was given a financial letter the period of July 2020, she says coverage should have been effective and has credit discretions on [employer] inquiries, Pls return call

## 2021-09-01 NOTE — Telephone Encounter (Signed)
I return Pt call, unable to LVM, Pt saw the financial rep. On 02/28/2018, her approval was from 02/27/18 to 08/30/18, she did not apply with [facility] financial dept. Since 2019
